# Patient Record
Sex: Male | Born: 1995
Health system: Southern US, Community
[De-identification: ages and names within clinical notes are randomized; demographics above are authoritative.]

## PROBLEM LIST (undated history)

## (undated) DIAGNOSIS — F32A Depression, unspecified: Secondary | ICD-10-CM

## (undated) DIAGNOSIS — I10 Essential (primary) hypertension: Secondary | ICD-10-CM

## (undated) DIAGNOSIS — IMO0002 Reserved for concepts with insufficient information to code with codable children: Secondary | ICD-10-CM

## (undated) DIAGNOSIS — F329 Major depressive disorder, single episode, unspecified: Secondary | ICD-10-CM

## (undated) DIAGNOSIS — M329 Systemic lupus erythematosus, unspecified: Secondary | ICD-10-CM

## (undated) DIAGNOSIS — M1990 Unspecified osteoarthritis, unspecified site: Secondary | ICD-10-CM

## (undated) DIAGNOSIS — T7840XA Allergy, unspecified, initial encounter: Secondary | ICD-10-CM

## (undated) DIAGNOSIS — D689 Coagulation defect, unspecified: Secondary | ICD-10-CM

## (undated) HISTORY — DX: Allergy, unspecified, initial encounter: T78.40XA

## (undated) HISTORY — PX: COSMETIC SURGERY: SHX468

## (undated) HISTORY — DX: Coagulation defect, unspecified: D68.9

## (undated) HISTORY — DX: Unspecified osteoarthritis, unspecified site: M19.90

## (undated) HISTORY — DX: Systemic lupus erythematosus, unspecified: M32.9

## (undated) HISTORY — DX: Reserved for concepts with insufficient information to code with codable children: IMO0002

## (undated) HISTORY — DX: Major depressive disorder, single episode, unspecified: F32.9

## (undated) HISTORY — PX: LYMPH NODE BIOPSY: SHX201

## (undated) HISTORY — PX: TONSILLECTOMY: SUR1361

## (undated) HISTORY — DX: Depression, unspecified: F32.A

---
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
## 2013-06-28 DIAGNOSIS — D693 Immune thrombocytopenic purpura: Secondary | ICD-10-CM | POA: Insufficient documentation

## 2014-12-27 ENCOUNTER — Ambulatory Visit (INDEPENDENT_AMBULATORY_CARE_PROVIDER_SITE_OTHER): Payer: BLUE CROSS/BLUE SHIELD | Admitting: Physician Assistant

## 2014-12-27 VITALS — BP 116/66 | HR 87 | Temp 98.7°F | Resp 18 | Ht 66.0 in | Wt 130.0 lb

## 2014-12-27 DIAGNOSIS — Z0000 Encounter for general adult medical examination without abnormal findings: Secondary | ICD-10-CM | POA: Diagnosis not present

## 2014-12-27 DIAGNOSIS — Z113 Encounter for screening for infections with a predominantly sexual mode of transmission: Secondary | ICD-10-CM | POA: Diagnosis not present

## 2014-12-27 DIAGNOSIS — M329 Systemic lupus erythematosus, unspecified: Secondary | ICD-10-CM | POA: Insufficient documentation

## 2014-12-27 DIAGNOSIS — D696 Thrombocytopenia, unspecified: Secondary | ICD-10-CM | POA: Diagnosis not present

## 2014-12-27 LAB — POCT CBC
Granulocyte percent: 75.7 %G (ref 37–80)
HCT, POC: 42.1 % — AB (ref 43.5–53.7)
HEMOGLOBIN: 13.7 g/dL — AB (ref 14.1–18.1)
Lymph, poc: 0.9 (ref 0.6–3.4)
MCH: 27.7 pg (ref 27–31.2)
MCHC: 32.5 g/dL (ref 31.8–35.4)
MCV: 85.1 fL (ref 80–97)
MID (CBC): 0.2 (ref 0–0.9)
MPV: 11.9 fL (ref 0–99.8)
PLATELET COUNT, POC: 28 10*3/uL — AB (ref 142–424)
POC Granulocyte: 3.5 (ref 2–6.9)
POC LYMPH PERCENT: 20.1 %L (ref 10–50)
POC MID %: 4.2 %M (ref 0–12)
RBC: 4.95 M/uL (ref 4.69–6.13)
RDW, POC: 12.8 %
WBC: 4.6 10*3/uL (ref 4.6–10.2)

## 2014-12-27 NOTE — Patient Instructions (Signed)
Your exam was normal today. Your platelet count remained low. Please follow up with your rheumatologist as scheduled.   Health Maintenance - 86-21 Years Old SCHOOL PERFORMANCE After high school, you may attend college or technical or vocational school, enroll in the [employer], or enter the workforce. PHYSICAL, SOCIAL, AND EMOTIONAL DEVELOPMENT  One hour of regular physical activity daily is recommended. Continue to participate in sports.  Develop your own interests and consider community service or volunteerism.  Make decisions about college and work plans.  Throughout these years, you should assume responsibility for your own health care. Increasing independence is important for you.  You may be exploring your sexual identity. Understand that you should never be in a situation that makes you feel uncomfortable, and tell your partner if you do not want to engage in sexual activity.  Body image may become important to you. Be mindful that eating disorders can develop at this time. Talk to your parents or other caregivers if you have concerns about body image, weight gain, or losing weight.  You may notice mood disturbances, depression, anxiety, attention problems, or trouble with alcohol. Talk to your health care provider if you have concerns about mental illness.  Set limits for yourself and talk with your parents or other caregivers about independent decision making.  Handle conflict without physical violence.  Avoid loud noises which may impair hearing.  Limit television and computer time to 2 hours each day. Individuals who engage in excessive inactivity are more likely to become overweight. RECOMMENDED IMMUNIZATIONS  Influenza vaccine.  All adults should be immunized every year.  All adults, including pregnant women and people with hives-only allergy to eggs, can receive the inactivated influenza (IIV) vaccine.  Adults aged 18-49 years can receive the recombinant influenza  (RIV) vaccine. The RIV vaccine does not contain any egg protein.  Tetanus, diphtheria, and acellular pertussis (Td, Tdap) vaccine.  Pregnant women should receive 1 dose of Tdap vaccine during each pregnancy. The dose should be obtained regardless of the length of time since the last dose. Immunization is preferred during the 27th to 36th week of gestation.  An adult who has not previously received Tdap or who does not know his or her vaccine status should receive 1 dose of Tdap. This initial dose should be followed by tetanus and diphtheria toxoids (Td) booster doses every 10 years.  Adults with an unknown or incomplete history of completing a 3-dose immunization series with Td-containing vaccines should begin or complete a primary immunization series including a Tdap dose.  Adults should receive a Td booster every 10 years.  Varicella vaccine.  An adult without evidence of immunity to varicella should receive 2 doses or a second dose if he or she has previously received 1 dose.  Pregnant females who do not have evidence of immunity should receive the first dose after pregnancy. This first dose should be obtained before leaving the health care facility. The second dose should be obtained 4-8 weeks after the first dose.  Human papillomavirus (HPV) vaccine.  Females aged 13-26 years who have not received the vaccine previously should obtain the 3-dose series.  The vaccine is not recommended for pregnant females. However, pregnancy testing is not needed before receiving a dose. If a male is found to be pregnant after receiving a dose, no treatment is needed. In that case, the remaining doses should be delayed until after the pregnancy.  Males aged 61-21 years who have not received the vaccine previously should receive the 3-dose  series. Males aged 22-26 years may be immunized.  Immunization is recommended through the age of 56 years for any male who has sex with males and did not get any or  all doses earlier.  Immunization is recommended for any person with an immunocompromised condition through the age of 60 years if he or she did not get any or all doses earlier.  During the 3-dose series, the second dose should be obtained 4-8 weeks after the first dose. The third dose should be obtained 24 weeks after the first dose and 16 weeks after the second dose.  Measles, mumps, and rubella (MMR) vaccine.  Adults born in [birth year] or later should have 1 or more doses of MMR vaccine unless there is a contraindication to the vaccine or there is laboratory evidence of immunity to each of the three diseases.  A routine second dose of MMR vaccine should be obtained at least 28 days after the first dose for students attending postsecondary schools, health care workers, and international travelers.  For females of childbearing age, rubella immunity should be determined. If there is no evidence of immunity, females who are not pregnant should be vaccinated. If there is no evidence of immunity, females who are pregnant should delay immunization until after pregnancy.  Pneumococcal 13-valent conjugate (PCV13) vaccine.  When indicated, a person who is uncertain of his or her immunization history and has no record of immunization should receive the PCV13 vaccine.  An adult aged 69 years or older who has certain medical conditions and has not been previously immunized should receive 1 dose of PCV13 vaccine. This PCV13 should be followed with a dose of pneumococcal polysaccharide (PPSV23) vaccine. The PPSV23 vaccine dose should be obtained at least 8 weeks after the dose of PCV13 vaccine.  An adult aged 46 years or older who has certain medical conditions and previously received 1 or more doses of PPSV23 vaccine should receive 1 dose of PCV13. The PCV13 vaccine dose should be obtained 1 or more years after the last PPSV23 vaccine dose.  Pneumococcal polysaccharide (PPSV23) vaccine.  When PCV13 is also  indicated, PCV13 should be obtained first.  An adult younger than age 44 years who has certain medical conditions should be immunized.  Any person who resides in a long-term care facility should be immunized.  An adult smoker should be immunized.  People with an immunocompromised condition and certain other conditions should receive both PCV13 and PPSV23 vaccines.  People with human immunodeficiency virus (HIV) infection should be immunized as soon as possible after diagnosis.  Immunization during chemotherapy or radiation therapy should be avoided.  Routine use of PPSV23 vaccine is not recommended for American Indians, [place] Natives, or people younger than 65 years unless there are medical conditions that require PPSV23 vaccine.  When indicated, people who have unknown immunization and have no record of immunization should receive PPSV23 vaccine.  One-time revaccination 5 years after the first dose of PPSV23 is recommended for people aged 19-64 years who have chronic kidney failure, nephrotic syndrome, asplenia, or immunocompromised conditions.  Meningococcal vaccine.  Adults with asplenia or persistent complement component deficiencies should receive 2 doses of quadrivalent meningococcal conjugate (MenACWY-D) vaccine. The doses should be obtained at least 2 months apart.  Microbiologists working with certain meningococcal bacteria, [employer], people at risk during an outbreak, and people who travel to or live in countries with a high rate of meningitis should be immunized.  A first-year college student up through age 9 years who  is living in a residence hall should receive a dose if he or she did not receive a dose on or after his or her 16th birthday.  Adults who have certain high-risk conditions should receive one or more doses of vaccine.  Hepatitis A vaccine.  Adults who wish to be protected from this disease, have certain high-risk conditions, work with hepatitis  A-infected animals, work in hepatitis A research labs, or travel to or work in countries with a high rate of hepatitis A should be immunized.  Adults who were previously unvaccinated and who anticipate close contact with an international adoptee during the first 60 days after arrival in the [place] from a country with a high rate of hepatitis A should be immunized.  Hepatitis B vaccine.  Adults who wish to be protected from this disease, have certain high-risk conditions, may be exposed to blood or other infectious body fluids, are household contacts or sex partners of hepatitis B positive people, are clients or workers in certain care facilities, or travel to or work in countries with a high rate of hepatitis B should be immunized.  Haemophilus influenzae type b (Hib) vaccine.  A previously unvaccinated person with asplenia or sickle cell disease or having a scheduled splenectomy should receive 1 dose of Hib vaccine.  Regardless of previous immunization, a recipient of a hematopoietic stem cell transplant should receive a 3-dose series 6-12 months after his or her successful transplant.  Hib vaccine is not recommended for adults with HIV infection. TESTING  Annual screening for vision and hearing problems is recommended. Vision should be screened at least once between 84-63 years of age.  You may be screened for anemia or tuberculosis.  You should have a blood test to check for high cholesterol.  You should be screened for alcohol and drug use.  If you are sexually active, you may be screened for sexually transmitted infections (STIs), pregnancy, or HIV. You should be screened for STIs if:  Your sexual activity has changed since the last screening test, and you are at an increased risk for chlamydia or gonorrhea. Ask your health care provider if you are at risk.  If you are at an increased risk for hepatitis B, you should be screened for this virus. You are considered at high risk  for hepatitis B if you:  Were born in a country where hepatitis B occurs often. Talk with your health care provider about which countries are considered high risk.  Have parents who were born in a high-risk country and have not received a shot to protect against hepatitis B (hepatitis B vaccine).  Have HIV or AIDS.  Use needles to inject street drugs.  Live with or have sex with someone who has hepatitis B.  Are a man who has sex with other men (MSM).  Get hemodialysis treatment.  Take certain medicines for conditions like cancer, organ transplantation, or autoimmune conditions. NUTRITION   You should:  Have three servings of low-fat milk and dairy products daily. If you do not drink milk or consume dairy products, you should eat calcium-enriched foods, such as juice, bread, or cereal. Dark, leafy greens or canned fish are alternate sources of calcium.  Drink plenty of water. Fruit juice should be limited to 8-12 oz (240-360 mL) each day. Sugary beverages and sodas should be avoided.  Avoid eating foods high in fat, salt, or sugar, such as chips, candy, and cookies.  Avoid fast foods and limit eating out at restaurants.  Try  not to skip meals, especially breakfast. You should eat a variety of vegetables, fruits, and lean meats.  Eat meals together as a family whenever possible. ORAL HEALTH Brush your teeth twice a day and floss at least once a day. You should have two dental exams a year.  SKIN CARE You should wear sunscreen when out in the sun. TALK TO SOMEONE ABOUT:  Precautions against pregnancy, contraception, and sexually transmitted infections.  Taking a prescription medicine daily to prevent HIV infection if you are at risk of being infected with HIV. This is called preexposure prophylaxis (PrEP). You are at risk if you:  Are a male who has sex with other males (MSM).  Are heterosexual and sexually active with more than one partner.  Take drugs by  injection.  Are sexually active with a partner who has HIV.  Whether you are at high risk of being infected with HIV. If you choose to begin PrEP, you should first be tested for HIV. You should then be tested every 3 months for as long as you are taking PrEP.  Drug, tobacco, and alcohol use among your friends or at friends' homes. Smoking tobacco or marijuana and taking drugs have health consequences and may impact your brain development.  Appropriate use of over-the-counter or prescription medicines.  Driving guidelines and riding with friends.  The risks of drinking and driving or boating. Call someone if you have been drinking or using drugs and need a ride. WHAT'S NEXT? Visit your pediatrician or family physician once a year. By young adulthood, you should transition from your pediatrician to a family physician or internal medicine specialist. If you are a male and are sexually active, you may want to begin annual physical exams with a gynecologist. Document Released: 12/18/2006 Document Revised: 09/27/2013 Document Reviewed: 01/07/2007 [facility] Patient Information 2015 [place], [place]. This information is not intended to replace advice given to you by your health care provider. Make sure you discuss any questions you have with your health care provider.

## 2014-12-27 NOTE — Progress Notes (Signed)
Subjective:    Patient ID: Chase Townsend, male    DOB: November 10, 1995, 19 y.o.   MRN: [id number]  Chief Complaint  Patient presents with  . Annual Exam   Patient Active Problem List   Diagnosis Date Noted  . Lupus 12/27/2014   Prior to Admission medications   Medication Sig Start Date End Date Taking? Authorizing Provider  hydroxychloroquine (PLAQUENIL) 200 MG tablet Take by mouth daily.   Yes Historical Provider, MD  loratadine (CLARITIN) 10 MG tablet Take 10 mg by mouth daily.   Yes Historical Provider, MD  predniSONE (DELTASONE) 10 MG tablet Take 10 mg by mouth daily with breakfast.   Yes Historical Provider, MD   Medications, allergies, past medical history, surgical history, family history, social history and problem list reviewed and updated.  HPI  [age] yom with pmh lupus with resulting low plt ct presents for cpe.   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 They require physical exam prior to starting. He starts next week. 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   Lupus - diag at age 89 yo. On hydroxychloroquine and prednisone qd. Sees [facility] rheumatology q 3 months. Next appt in 2 wks. Has hx low plts per pt due to lupus. Has occasional joint aches hips due to lupus. Pt states rheum follows all these conditions. Takes tylenol prn for joint pain. States he felt he was depressed for awhile after gma passed 2 yrs ago but was never diagnosed and never started any meds. Feeling much better now.   Up to date on all immunizations through his rheumatologist as he is on daily pred.  He does not exercise. Does not follow specific diet. Denies current issues, aches, pains, complaints.   Would like to get cbc checked today to monitor his plt ct. Agrees to HIV testing but declines any further HIV testing. Sexually active with one male partner. Condoms sometimes.    Positive ROS on sheet: HA: Assoc with allergies. Seasonal. Since childhood. No recent worsening.  Allergies:   Takes Claritin every day.  Bleds Easily: States has low plts due to lupus. Rheum follows.  Cough: Post nasal drip. Stable during allergy season. Not prod. No fever, chills, night sweats, unintentional wt loss. Dental probs - Needs work on lower tooth but cant get worked on till plt ct 50k,last checked was [employer]30k.   Review of Systems See HPI. ROS negative unless mentioned above.     Objective:   Physical Exam  Constitutional: He is oriented to person, place, and time. He appears well-developed and well-nourished.  Non-toxic appearance. He does not have a sickly appearance. He does not appear ill. No distress.  BP 116/66 mmHg  Pulse 87  Temp(Src) 98.7 F (37.1 C) (Oral)  Resp 18  Ht 5\' 6"  (1.676 m)  Wt 130 lb (58.968 kg)  BMI 20.99 kg/m2  SpO2 99%   HENT:  Right Ear: Tympanic membrane normal.  Left Ear: Tympanic membrane normal.  Mouth/Throat: Uvula is midline and oropharynx is clear and moist.  Eyes: Conjunctivae and EOM are normal. Pupils are equal, round, and reactive to light.  Neck: Trachea normal and normal range of motion. No spinous process tenderness and no muscular tenderness present. Carotid bruit is not present. No thyroid mass and no thyromegaly present.  Cardiovascular: Normal rate, regular rhythm and normal heart sounds.  Exam reveals no gallop.   No murmur heard. Pulses:      Dorsalis pedis pulses are 2+ on the right side,  and 2+ on the left side.  Pulmonary/Chest: Effort normal and breath sounds normal. He has no decreased breath sounds. He has no wheezes. He has no rhonchi. He has no rales.  Abdominal: Soft. Normal appearance and bowel sounds are normal. There is no hepatosplenomegaly. There is no tenderness. No hernia. Hernia confirmed negative in the right inguinal area and confirmed negative in the left inguinal area.  Genitourinary: Testes normal and penis normal. Right testis shows no mass. Left testis shows no mass.  Musculoskeletal: Normal range of motion.    Neurological: He is alert and oriented to person, place, and time. He has normal strength. No cranial nerve deficit or sensory deficit.  Psychiatric: He has a normal mood and affect. His speech is normal and behavior is normal.   Results for orders placed or performed in visit on 12/27/14  POCT CBC  Result Value Ref Range   WBC 4.6 4.6 - 10.2 K/uL   Lymph, poc 0.9 0.6 - 3.4   POC LYMPH PERCENT 20.1 10 - 50 %L   MID (cbc) 0.2 0 - 0.9   POC MID % 4.2 0 - 12 %M   POC Granulocyte 3.5 2 - 6.9   Granulocyte percent 75.7 37 - 80 %G   RBC 4.95 4.69 - 6.13 M/uL   Hemoglobin 13.7 (A) 14.1 - 18.1 g/dL   HCT, POC 16.1 (A) 09.6 - 53.7 %   MCV 85.1 80 - 97 fL   MCH, POC 27.7 27 - 31.2 pg   MCHC 32.5 31.8 - 35.4 g/dL   RDW, POC 04.5 %   Platelet Count, POC 28 (A) 142 - 424 K/uL   MPV 11.9 0 - 99.8 fL      Assessment & Plan:   19 yom with pmh lupus with resulting low plt ct presents for cpe.   Annual physical exam --normal vitals, normal exam --states he needed this through work --rtc as needed  Thrombocytopenia - Plan: POCT CBC Lupus - Plan: POCT CBC --followed by [facility] rhe[facility], low plts today as normal per pt, pt wanted to check his level, planning to follow with rheum in 2 wks  Screen for STD (sexually transmitted disease) - Plan: HIV antibody --HIV screen today, pt declines any other std testing  Donnajean Lopes, PA-C Physician Assistant-Certified [facility]  12/27/2014 5:36 PM

## 2014-12-28 LAB — HIV ANTIBODY (ROUTINE TESTING W REFLEX): HIV 1&2 Ab, 4th Generation: NONREACTIVE

## 2016-02-08 ENCOUNTER — Emergency Department (HOSPITAL_COMMUNITY)
Admission: EM | Admit: 2016-02-08 | Discharge: 2016-02-08 | Disposition: A | Discharge status: Home / Self Care | Payer: BLUE CROSS/BLUE SHIELD | Attending: Emergency Medicine | Admitting: Emergency Medicine

## 2016-02-08 ENCOUNTER — Encounter (HOSPITAL_COMMUNITY): Payer: Self-pay

## 2016-02-08 DIAGNOSIS — F321 Major depressive disorder, single episode, moderate: Secondary | ICD-10-CM | POA: Diagnosis present

## 2016-02-08 DIAGNOSIS — F1099 Alcohol use, unspecified with unspecified alcohol-induced disorder: Secondary | ICD-10-CM | POA: Insufficient documentation

## 2016-02-08 DIAGNOSIS — F329 Major depressive disorder, single episode, unspecified: Secondary | ICD-10-CM | POA: Insufficient documentation

## 2016-02-08 DIAGNOSIS — R45851 Suicidal ideations: Secondary | ICD-10-CM | POA: Insufficient documentation

## 2016-02-08 DIAGNOSIS — Z7289 Other problems related to lifestyle: Secondary | ICD-10-CM | POA: Diagnosis not present

## 2016-02-08 DIAGNOSIS — M1990 Unspecified osteoarthritis, unspecified site: Secondary | ICD-10-CM | POA: Insufficient documentation

## 2016-02-08 DIAGNOSIS — Z789 Other specified health status: Secondary | ICD-10-CM | POA: Insufficient documentation

## 2016-02-08 DIAGNOSIS — Z79899 Other long term (current) drug therapy: Secondary | ICD-10-CM | POA: Diagnosis not present

## 2016-02-08 DIAGNOSIS — Z7952 Long term (current) use of systemic steroids: Secondary | ICD-10-CM | POA: Diagnosis not present

## 2016-02-08 DIAGNOSIS — D696 Thrombocytopenia, unspecified: Secondary | ICD-10-CM | POA: Diagnosis not present

## 2016-02-08 DIAGNOSIS — F32A Depression, unspecified: Secondary | ICD-10-CM | POA: Diagnosis present

## 2016-02-08 LAB — COMPREHENSIVE METABOLIC PANEL
ALT: 25 U/L (ref 17–63)
ANION GAP: 8 (ref 5–15)
AST: 32 U/L (ref 15–41)
Albumin: 4.6 g/dL (ref 3.5–5.0)
Alkaline Phosphatase: 67 U/L (ref 38–126)
BILIRUBIN TOTAL: 1 mg/dL (ref 0.3–1.2)
BUN: 12 mg/dL (ref 6–20)
CHLORIDE: 109 mmol/L (ref 101–111)
CO2: 27 mmol/L (ref 22–32)
Calcium: 9.1 mg/dL (ref 8.9–10.3)
Creatinine, Ser: 0.59 mg/dL — ABNORMAL LOW (ref 0.61–1.24)
Glucose, Bld: 91 mg/dL (ref 65–99)
POTASSIUM: 4 mmol/L (ref 3.5–5.1)
Sodium: 144 mmol/L (ref 135–145)
TOTAL PROTEIN: 8.5 g/dL — AB (ref 6.5–8.1)

## 2016-02-08 LAB — RAPID URINE DRUG SCREEN, HOSP PERFORMED
AMPHETAMINES: NOT DETECTED
BENZODIAZEPINES: NOT DETECTED
Barbiturates: NOT DETECTED
Cocaine: NOT DETECTED
OPIATES: NOT DETECTED
Tetrahydrocannabinol: NOT DETECTED

## 2016-02-08 LAB — CBC
HCT: 41.2 % (ref 39.0–52.0)
Hemoglobin: 13.9 g/dL (ref 13.0–17.0)
MCH: 28.8 pg (ref 26.0–34.0)
MCHC: 33.7 g/dL (ref 30.0–36.0)
MCV: 85.5 fL (ref 78.0–100.0)
PLATELETS: 64 10*3/uL — AB (ref 150–400)
RBC: 4.82 MIL/uL (ref 4.22–5.81)
RDW: 13.1 % (ref 11.5–15.5)
WBC: 2.5 10*3/uL — AB (ref 4.0–10.5)

## 2016-02-08 LAB — SALICYLATE LEVEL

## 2016-02-08 LAB — ACETAMINOPHEN LEVEL

## 2016-02-08 LAB — ETHANOL: ALCOHOL ETHYL (B): 115 mg/dL — AB (ref <5)

## 2016-02-08 MED ORDER — HYDROXYCHLOROQUINE SULFATE 200 MG PO TABS
200.0000 mg | ORAL_TABLET | Freq: Every day | ORAL | Status: DC
  Administered 2016-02-08: 200 mg via ORAL
  Filled 2016-02-08 (×2): qty 1

## 2016-02-08 MED ORDER — LORAZEPAM 1 MG PO TABS: 1.0000 mg | ORAL_TABLET | Freq: Three times a day (TID) | ORAL | Status: DC | PRN

## 2016-02-08 MED ORDER — PREDNISONE 20 MG PO TABS
10.0000 mg | ORAL_TABLET | Freq: Every day | ORAL | Status: DC
  Administered 2016-02-08: 10 mg via ORAL
  Filled 2016-02-08: qty 1

## 2016-02-08 MED ORDER — ZOLPIDEM TARTRATE 5 MG PO TABS: 5.0000 mg | ORAL_TABLET | Freq: Every evening | ORAL | Status: DC | PRN

## 2016-02-08 MED ORDER — MYCOPHENOLATE SODIUM 180 MG PO TBEC
360.0000 mg | DELAYED_RELEASE_TABLET | Freq: Two times a day (BID) | ORAL | Status: DC
  Administered 2016-02-08: 360 mg via ORAL
  Filled 2016-02-08 (×3): qty 2

## 2016-02-08 MED ORDER — ACETAMINOPHEN 325 MG PO TABS: 650.0000 mg | ORAL_TABLET | ORAL | Status: DC | PRN

## 2016-02-08 MED ORDER — ALUM & MAG HYDROXIDE-SIMETH 200-200-20 MG/5ML PO SUSP: 30.0000 mL | ORAL | Status: DC | PRN

## 2016-02-08 MED ORDER — ONDANSETRON HCL 4 MG PO TABS: 4.0000 mg | ORAL_TABLET | Freq: Three times a day (TID) | ORAL | Status: DC | PRN

## 2016-02-08 MED ORDER — LORATADINE 10 MG PO TABS
10.0000 mg | ORAL_TABLET | Freq: Every day | ORAL | Status: DC
  Administered 2016-02-08: 10 mg via ORAL
  Filled 2016-02-08 (×2): qty 1

## 2016-02-08 NOTE — ED Notes (Signed)
Pt admitted to room #41. Pt behavior calm and cooperative, pleasant on approach. Pt reports "I have had suicidal thoughts since 14, I manage them well." Endorsing SI. Denies HI. Denies AVH. Denies previous suicide attempt. Pt reports this is the first time his family has been aware of these thoughts, resulting in his mother calling [employer] and pt coming to the ED. Pt reports his sister attempted suicide 5 years ago. Pt reports he is currently not on any medication for mental health. 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 Pt verbalizes exams are coming up. Reports he went to counseling at school last year (2016) d/t his grandmothers passing a few years ago. Pt denies hx of aggression. Denies use of drugs. Pt reports weekly use of Alcohol. Reports he was at a party last night and had his last drink at 0400. Pt denies any withdrawal symptoms at this time. Special checks q 15 mins in place for safety. Video monitoring in place.

## 2016-02-08 NOTE — Discharge Instructions (Addendum)
For your ongoing behavioral health needs, you are advised to follow up with the Partial Hospitalization Program at the [facility] [place].  The contact person for this program is [facility] Bowman.  Call her at your earliest opportunity to ask about enrolling in the program:       [facility]      [address]      [place], [place] [address]      Contact person: Claudius SisMary Alice Bowman      [phone]   For academic support for your behavioral health needs you are advised to follow up at your earliest opportunity with the Counseling Center at [facility] [employer] [facility].  New patients are seen during walking hours, Monday - Friday from 8:00 am - 5:00 pm.  Emergency Department staff has notified them by telephone of your visit to the ED and your recommended follow up:       [facility] [employer]      Counseling Services      [address]      [phone]   If you have a behavioral health crisis, call the [facility] Mobile Crisis Team.  They are available 24 hours a day, 7 days a week.  Call them and they will have a clinician come to you:       [facility] Crisis Team      [phone]

## 2016-02-08 NOTE — ED Notes (Signed)
Pt presents w/ [facility] c/o suicidal thoughts x "6 years" increasing today.  Pt reports plan to "drive over an overpass."  Pt reports that he was bullied as a child which prompted the SI and sts he broke up with his girlfriend this morning.  Pt has never been evaluated by a counselor or psychiatrist.  Denies HI/AV.

## 2016-02-08 NOTE — Consult Note (Signed)
[facility] Face-to-Face Psychiatry Consult   Reason for Consult:  Depression and suicidal ideations Referring Physician:  EDP Patient Identification: Chase Townsend MRN:  [id number] Principal Diagnosis: Major depressive disorder, single episode, moderate ([place]) Diagnosis:   Patient Active Problem List   Diagnosis Date Noted  . Major depressive disorder, single episode, moderate ([place]) [F32.1] 02/08/2016    Priority: High  . Lupus ([place]) [M32.9] 12/27/2014    Total Time spent with patient: 45 minutes  Subjective:   Chase Townsend is a 20 y.o. male patient does not want admission.  HPI:  20 yo male who presented to the ED with depression and suicidal ideations.  He reports having dealt with his stressors himself until now.  Chase Townsend does not have suicidal ideations today and feels much better after his mother visited.  He reports she is his major support system along with an extensive supportive family.  His mother and he would like outpatient or intensive outpatient.  The plan is for him to go home this weekend with his mother and family.  He will then return to school next week for his exams then go to outpatient and home for the summer.  No homicidal ideations, hallucinations, and substance abuse.  The psychiatrist, Dr. Darleene Cleaver, recommended inpatient hospitalization but family and patient did not want to go forward with the admission.  No past suicide attempts and no current suicidal ideations, strong support system who is willing to take responsibility.  Resources for intensive outpatient therapy and crisis numbers provided.  Past Psychiatric History: none  Risk to Self: Suicidal Ideation: Yes-Currently Present Suicidal Intent: No Is patient at risk for suicide?: No Suicidal Plan?: No Access to Means: No What has been your use of drugs/alcohol within the last 12 months?: Current alcohol use How many times?: 0 Other Self Harm Risks: none Triggers for Past Attempts: Unpredictable (school, family,  relationships) Intentional Self Injurious Behavior: None Risk to Others: Homicidal Ideation: No Thoughts of Harm to Others: No Current Homicidal Intent: No Current Homicidal Plan: No Access to Homicidal Means: No Identified Victim: na History of harm to others?: No Assessment of Violence: None Noted Violent Behavior Description: na Does patient have access to weapons?: No Criminal Charges Pending?: No Does patient have a court date: No Prior Inpatient Therapy: Prior Inpatient Therapy: No Prior Therapy Dates: na Prior Therapy Facilty/Provider(s): na Reason for Treatment: na Prior Outpatient Therapy: Prior Outpatient Therapy: Yes Prior Therapy Dates: 2016 Prior Therapy Facilty/Provider(s): [facility] Reason for Treatment: Depression Does patient have an ACCT team?: No Does patient have Intensive In-House Services?  : No Does patient have [facility] services? : No Does patient have P4[facility] services?: No  Past Medical History:  Past Medical History  Diagnosis Date  . Allergy   . Arthritis   . Clotting disorder ([place])   . Depression   . Lupus [facility])     Past Surgical History  Procedure Laterality Date  . Tonsillectomy    . Cosmetic surgery    . Lymph node biopsy     Family History:  Family History  Problem Relation Age of Onset  . Stroke Maternal Grandmother   . Cancer Maternal Grandmother   . Diabetes Maternal Grandmother   . Hyperlipidemia Maternal Grandfather    Family Psychiatric  History: none Social History:  History  Alcohol Use  . 2.4 oz/week  . 4 Standard drinks or equivalent per week     History  Drug Use No    Social History   Social History  .  Marital Status: Single    Spouse Name: N/A  . Number of Children: N/A  . Years of Education: N/A   Social History Main Topics  . Smoking status: Never Smoker   . Smokeless tobacco: None  . Alcohol Use: 2.4 oz/week    4 Standard drinks or equivalent per week  . Drug Use: No  . Sexual Activity: Yes   Other  Topics Concern  . None   Social History Narrative   Additional Social History:    Allergies:   Allergies  Allergen Reactions  . Aspirin   . Singulair [Montelukast Sodium]     Labs:  Results for orders placed or performed during the hospital encounter of 02/08/16 (from the past 48 hour(s))  Comprehensive metabolic panel     Status: Abnormal   Collection Time: 02/08/16  8:20 AM  Result Value Ref Range   Sodium 144 135 - 145 mmol/L   Potassium 4.0 3.5 - 5.1 mmol/L   Chloride 109 101 - 111 mmol/L   CO2 27 22 - 32 mmol/L   Glucose, Bld 91 65 - 99 mg/dL   BUN 12 6 - 20 mg/dL   Creatinine, Ser 0.59 (L) 0.61 - 1.24 mg/dL   Calcium 9.1 8.9 - 10.3 mg/dL   Total Protein 8.5 (H) 6.5 - 8.1 g/dL   Albumin 4.6 3.5 - 5.0 g/dL   AST 32 15 - 41 U/L   ALT 25 17 - 63 U/L   Alkaline Phosphatase 67 38 - 126 U/L   Total Bilirubin 1.0 0.3 - 1.2 mg/dL   GFR calc non Af Amer >60 >60 mL/min   GFR calc Af Amer >60 >60 mL/min    Comment: (NOTE) The eGFR has been calculated using the CKD EPI equation. This calculation has not been validated in all clinical situations. eGFR's persistently <60 mL/min signify possible Chronic Kidney Disease.    Anion gap 8 5 - 15  Ethanol     Status: Abnormal   Collection Time: 02/08/16  8:20 AM  Result Value Ref Range   Alcohol, Ethyl (B) 115 (H) <5 mg/dL    Comment:        LOWEST DETECTABLE LIMIT FOR SERUM ALCOHOL IS 5 mg/dL FOR MEDICAL PURPOSES ONLY   Salicylate level     Status: None   Collection Time: 02/08/16  8:20 AM  Result Value Ref Range   Salicylate Lvl <7.9 2.8 - 30.0 mg/dL  Acetaminophen level     Status: Abnormal   Collection Time: 02/08/16  8:20 AM  Result Value Ref Range   Acetaminophen (Tylenol), Serum <10 (L) 10 - 30 ug/mL    Comment:        THERAPEUTIC CONCENTRATIONS VARY SIGNIFICANTLY. A RANGE OF 10-30 ug/mL MAY BE AN EFFECTIVE CONCENTRATION FOR MANY PATIENTS. HOWEVER, SOME ARE BEST TREATED AT CONCENTRATIONS OUTSIDE  THIS RANGE. ACETAMINOPHEN CONCENTRATIONS >150 ug/mL AT 4 HOURS AFTER INGESTION AND >50 ug/mL AT 12 HOURS AFTER INGESTION ARE OFTEN ASSOCIATED WITH TOXIC REACTIONS.   cbc     Status: Abnormal   Collection Time: 02/08/16  8:20 AM  Result Value Ref Range   WBC 2.5 (L) 4.0 - 10.5 K/uL   RBC 4.82 4.22 - 5.81 MIL/uL   Hemoglobin 13.9 13.0 - 17.0 g/dL   HCT 41.2 39.0 - 52.0 %   MCV 85.5 78.0 - 100.0 fL   MCH 28.8 26.0 - 34.0 pg   MCHC 33.7 30.0 - 36.0 g/dL   RDW 13.1 11.5 - 15.5 %  Platelets 64 (L) 150 - 400 K/uL    Comment: SPECIMEN CHECKED FOR CLOTS PLATELET COUNT CONFIRMED BY SMEAR   Rapid urine drug screen (hospital performed)     Status: None   Collection Time: 02/08/16  8:31 AM  Result Value Ref Range   Opiates NONE DETECTED NONE DETECTED   Cocaine NONE DETECTED NONE DETECTED   Benzodiazepines NONE DETECTED NONE DETECTED   Amphetamines NONE DETECTED NONE DETECTED   Tetrahydrocannabinol NONE DETECTED NONE DETECTED   Barbiturates NONE DETECTED NONE DETECTED    Comment:        DRUG SCREEN FOR MEDICAL PURPOSES ONLY.  IF CONFIRMATION IS NEEDED FOR ANY PURPOSE, NOTIFY LAB WITHIN 5 DAYS.        LOWEST DETECTABLE LIMITS FOR URINE DRUG SCREEN Drug Class       Cutoff (ng/mL) Amphetamine      1000 Barbiturate      200 Benzodiazepine   960 Tricyclics       454 Opiates          300 Cocaine          300 THC              50     Current Facility-Administered Medications  Medication Dose Route Frequency Provider Last Rate Last Dose  . acetaminophen (TYLENOL) tablet 650 mg  650 mg Oral Q4H PRN Mercedes Camprubi-Soms, PA-C      . alum & mag hydroxide-simeth (MAALOX/MYLANTA) 200-200-20 MG/5ML suspension 30 mL  30 mL Oral PRN Mercedes Camprubi-Soms, PA-C      . hydroxychloroquine (PLAQUENIL) tablet 200 mg  200 mg Oral Daily Mercedes Camprubi-Soms, PA-C   200 mg at 02/08/16 1049  . loratadine (CLARITIN) tablet 10 mg  10 mg Oral Daily Mercedes Camprubi-Soms, PA-C   10 mg at 02/08/16  1047  . LORazepam (ATIVAN) tablet 1 mg  1 mg Oral Q8H PRN Mercedes Camprubi-Soms, PA-C      . mycophenolate (MYFORTIC) EC tablet 360 mg  360 mg Oral BID Mercedes Camprubi-Soms, PA-C   360 mg at 02/08/16 1047  . ondansetron (ZOFRAN) tablet 4 mg  4 mg Oral Q8H PRN Mercedes Camprubi-Soms, PA-C      . predniSONE (DELTASONE) tablet 10 mg  10 mg Oral Q breakfast Mercedes Camprubi-Soms, PA-C   10 mg at 02/08/16 0945  . zolpidem (AMBIEN) tablet 5 mg  5 mg Oral QHS PRN Mercedes Camprubi-Soms, PA-C       Current Outpatient Prescriptions  Medication Sig Dispense Refill  . hydroxychloroquine (PLAQUENIL) 200 MG tablet Take by mouth daily.    Marland Kitchen loratadine (CLARITIN) 10 MG tablet Take 10 mg by mouth daily.    . predniSONE (DELTASONE) 10 MG tablet Take 10 mg by mouth daily with breakfast.      Musculoskeletal: Strength & Muscle Tone: within normal limits Gait & Station: normal Patient leans: N/A  Psychiatric Specialty Exam: Review of Systems  Constitutional: Negative.   HENT: Negative.   Eyes: Negative.   Respiratory: Negative.   Cardiovascular: Negative.   Gastrointestinal: Negative.   Genitourinary: Negative.   Musculoskeletal: Negative.   Skin: Negative.   Neurological: Negative.   Endo/Heme/Allergies: Negative.   Psychiatric/Behavioral: Positive for depression.    Blood pressure 140/108, pulse 81, temperature 98.2 F (36.8 C), temperature source Oral, resp. rate 14, SpO2 100 %.There is no weight on file to calculate BMI.  General Appearance: Casual  Eye Contact::  Fair  Speech:  Normal Rate  Volume:  Normal  Mood:  Depressed  Affect:  Congruent  Thought Process:  Coherent  Orientation:  Full (Time, Place, and Person)  Thought Content:  Rumination  Suicidal Thoughts:  No  Homicidal Thoughts:  No  Memory:  Immediate;   Good Recent;   Good Remote;   Good  Judgement:  Fair  Insight:  Good  Psychomotor Activity:  Normal  Concentration:  Good  Recall:  Good  Fund of  Knowledge:Good  Language: Good  Akathisia:  No  Handed:  Right  AIMS (if indicated):     Assets:  Communication Skills Desire for Improvement Financial Resources/Insurance Housing Intimacy Leisure Time Physical Health Resilience Social Support Talents/Skills Transportation Vocational/Educational  ADL's:  Intact  Cognition: WNL  Sleep:      Treatment Plan Summary: Daily contact with patient to assess and evaluate symptoms and progress in treatment, Medication management and Plan major depressive disorder, single episode moderater without psychotic features:  -Crisis stabilization -Medication management:  None at this time -Individual counseling  -Referral to [facility] intensive outpatient -Crisis numbers provided  Disposition: Recommended inpatient but family and patient declined  Waylan Boga, NP 02/08/2016 11:36 AM Patient seen face-to-face for psychiatric evaluation, chart reviewed and case discussed with the physician extender and developed treatment plan. Reviewed the information documented and agree with the treatment plan. Corena Pilgrim, MD

## 2016-02-08 NOTE — ED Notes (Signed)
TTS at bedside. 

## 2016-02-08 NOTE — ED Notes (Signed)
Bed: [facility]4 Expected date:  Expected time:  Means of arrival:  Comments: 

## 2016-02-08 NOTE — ED Notes (Addendum)
Pt changed into scrubs.  Pt and belongings wanded by Security.  

## 2016-02-08 NOTE — ED Provider Notes (Signed)
CSN: [id number]     Arrival date & time 02/08/16  0744 History   First MD Initiated Contact with Patient 02/08/16 [phone]     Chief Complaint  Patient presents with  . Suicidal     (Consider location/radiation/quality/duration/timing/severity/associated sxs/prior Treatment) HPI Comments: Chase Townsend is a 20 y.o. male with a PMHx of arthritis, depression, thrombocytopenia, and lupus, who presents to the ED via [facility] with complaints of suicidal ideations with a plan to drive off of an overpass. Patient states he has had 6 years of suicidal thoughts, but they increased today because he broke up with his girlfriend this morning. He states that he became very emotional and told his family that he was thinking about suicide, they called [employer] to bring him here for help. He is here voluntarily. He has never been evaluated by a psychiatrist or counselor, and he takes no psychiatric medications. He denies any HI/AVH, drug use, smoking, or any other medical complaints at this time. He does admit to drinking socially, drink last night approximately 4-5 shots of tequila between 10 PM and 2 AM, but states that this is a very infrequent occurrence. He is in college. Takes his prednisone, plaquenil, and mycophenolate regularly (followed by [facility] for lupus).   Patient is a 20 y.o. male presenting with mental health disorder. The history is provided by the patient and the police. No language interpreter was used.  Mental Health Problem Presenting symptoms: depression and suicidal thoughts   Presenting symptoms: no hallucinations and no homicidal ideas   Patient accompanied by:  Law enforcement Onset quality:  Gradual Duration: 6 years. Timing:  Constant Progression:  Waxing and waning Chronicity:  Chronic Context: stressful life event   Treatment compliance:  Untreated Relieved by:  None tried Worsened by:  Nothing tried Ineffective treatments:  None tried Associated symptoms: no abdominal pain and no chest  pain   Risk factors: hx of mental illness     Past Medical History  Diagnosis Date  . Allergy   . Arthritis   . Clotting disorder   . Depression   . Lupus    Past Surgical History  Procedure Laterality Date  . Tonsillectomy    . Cosmetic surgery    . Lymph node biopsy     Family History  Problem Relation Age of Onset  . Stroke Maternal Grandmother   . Cancer Maternal Grandmother   . Diabetes Maternal Grandmother   . Hyperlipidemia Maternal Grandfather    Social History  Substance Use Topics  . Smoking status: Never Smoker   . Smokeless tobacco: Not on file  . Alcohol Use: 2.4 oz/week    4 Standard drinks or equivalent per week    Review of Systems  Constitutional: Negative for fever and chills.  Respiratory: Negative for shortness of breath.   Cardiovascular: Negative for chest pain.  Gastrointestinal: Negative for nausea, vomiting, abdominal pain, diarrhea and constipation.  Genitourinary: Negative for dysuria and hematuria.  Musculoskeletal: Negative for myalgias and arthralgias.  Skin: Negative for color change.  Allergic/Immunologic: Positive for immunocompromised state (on plaquenil and mycophenolate).  Neurological: Negative for weakness and numbness.  Psychiatric/Behavioral: Positive for suicidal ideas. Negative for homicidal ideas, hallucinations and confusion.   10 Systems reviewed and are negative for acute change except as noted in the HPI.    Allergies  Aspirin and Singulair  Home Medications   Prior to Admission medications   Medication Sig Start Date End Date Taking? Authorizing Provider  hydroxychloroquine (PLAQUENIL) 200 MG tablet  Take by mouth daily.    Historical Provider, MD  loratadine (CLARITIN) 10 MG tablet Take 10 mg by mouth daily.    Historical Provider, MD  predniSONE (DELTASONE) 10 MG tablet Take 10 mg by mouth daily with breakfast.    Historical Provider, MD   BP 140/108 mmHg  Pulse 81  Temp(Src) 98.2 F (36.8 C) (Oral)  Resp  14  SpO2 100% Physical Exam  Constitutional: He is oriented to person, place, and time. Vital signs are normal. He appears well-developed and well-nourished.  Non-toxic appearance. No distress.  Afebrile, nontoxic, NAD  HENT:  Head: Normocephalic and atraumatic.  Mouth/Throat: Oropharynx is clear and moist and mucous membranes are normal.  Eyes: Conjunctivae and EOM are normal. Right eye exhibits no discharge. Left eye exhibits no discharge.  Neck: Normal range of motion. Neck supple.  Cardiovascular: Normal rate, regular rhythm, normal heart sounds and intact distal pulses.  Exam reveals no gallop and no friction rub.   No murmur heard. Pulmonary/Chest: Effort normal and breath sounds normal. No respiratory distress. He has no decreased breath sounds. He has no wheezes. He has no rhonchi. He has no rales.  Abdominal: Soft. Normal appearance and bowel sounds are normal. He exhibits no distension. There is no tenderness. There is no rigidity, no rebound, no guarding, no CVA tenderness, no tenderness at McBurney's point and negative Murphy's sign.  Musculoskeletal: Normal range of motion.  Neurological: He is alert and oriented to person, place, and time. He has normal strength. No sensory deficit.  Skin: Skin is warm, dry and intact. No rash noted.  Psychiatric: He is not actively hallucinating. He exhibits a depressed mood. He expresses suicidal ideation. He expresses no homicidal ideation. He expresses suicidal plans. He expresses no homicidal plans.  Pleasant and cooperative, slightly depressed affect, endorsing SI with plan to drive off overpass, but denies HI/AVH  Nursing note and vitals reviewed.   ED Course  Procedures (including critical care time) Labs Review Labs Reviewed  COMPREHENSIVE METABOLIC PANEL - Abnormal; Notable for the following:    Creatinine, Ser 0.59 (*)    Total Protein 8.5 (*)    All other components within normal limits  ETHANOL - Abnormal; Notable for the  following:    Alcohol, Ethyl (B) 115 (*)    All other components within normal limits  ACETAMINOPHEN LEVEL - Abnormal; Notable for the following:    Acetaminophen (Tylenol), Serum <10 (*)    All other components within normal limits  CBC - Abnormal; Notable for the following:    WBC 2.5 (*)    Platelets 64 (*)    All other components within normal limits  SALICYLATE LEVEL  URINE RAPID DRUG SCREEN, HOSP PERFORMED    Imaging Review No results found. I have personally reviewed and evaluated these images and lab results as part of my medical decision-making.   EKG Interpretation None      MDM   Final diagnoses:  Suicidal ideation  Alcohol use (HCC)  Thrombocytopenia (HCC)    20 y.o. male here with SI and plan to drive off overpass. Admits to socially drinking last night, not a chronic alcoholic. No HI/AVH. Pleasant and cooperative. Here voluntarily. Will get screening labs and TTS consultation. Will reassess shortly  9:15 AM UDS neg, CMP WNL, EtOH 115. Salicylate and tylenol levels unremarkable. CBC with chronic baseline thrombocytopenia, no acute findings. Pt medically cleared. Please see TTS/[facility] notes for further documentation of care/dispo. Home meds and psych holding orders placed.  BP  140/108 mmHg  Pulse 81  Temp(Src) 98.2 F (36.8 C) (Oral)  Resp 14  SpO2 100%  Meds ordered this encounter  Medications  . hydroxychloroquine (PLAQUENIL) tablet 200 mg    Sig:   . loratadine (CLARITIN) tablet 10 mg    Sig:   . predniSONE (DELTASONE) tablet 10 mg    Sig:   . mycophenolate (MYFORTIC) EC tablet 360 mg    Sig:   . alum & mag hydroxide-simeth (MAALOX/MYLANTA) 200-200-20 MG/5ML suspension 30 mL    Sig:   . ondansetron (ZOFRAN) tablet 4 mg    Sig:   . zolpidem (AMBIEN) tablet 5 mg    Sig:   . acetaminophen (TYLENOL) tablet 650 mg    Sig:   . LORazepam (ATIVAN) tablet 1 mg    Sig:        Joshia Kitchings Camprubi-Soms, PA-C 02/08/16 [id number]  Lyndal Pulleyaniel Knott,  MD 02/08/16 1730

## 2016-02-08 NOTE — ED Notes (Signed)
Pt discharged home to mother per MD order. This nurse reviewed discharge summary and follow up care with pt. Pt verbalizes understanding. Pt denies SI/HI, Denies AVH. Pt signed e-signature. Pt signed for and given  personal belongings. Pt family in lobby pending discharge. Pt ambulatory off unit.

## 2016-02-08 NOTE — ED Notes (Signed)
Bed: [facility]41 Expected date:  Expected time:  Means of arrival:  Comments: Triage 4 

## 2016-02-08 NOTE — [facility] Suicide Risk Assessment (Signed)
Suicide Risk Assessment  Discharge Assessment   [facility]   Principal Problem: Major depressive disorder, single episode, moderate ([facility]) Discharge Diagnoses:  Patient Active Problem List   Diagnosis Date Noted  . Major depressive disorder, single episode, moderate ([facility]) [F32.1] 02/08/2016    Priority: High  . Alcohol use ([facility]) [Z78.9]   . Lupus ([facility]) [M32.9] 12/27/2014    Total Time spent with patient: 45 minutes  Musculoskeletal: Strength & Muscle Tone: within normal limits Gait & Station: normal Patient leans: N/A  Psychiatric Specialty Exam: Review of Systems  Constitutional: Negative.  HENT: Negative.  Eyes: Negative.  Respiratory: Negative.  Cardiovascular: Negative.  Gastrointestinal: Negative.  Genitourinary: Negative.  Musculoskeletal: Negative.  Skin: Negative.  Neurological: Negative.  Endo/Heme/Allergies: Negative.  Psychiatric/Behavioral: Positive for depression.    Blood pressure 140/108, pulse 81, temperature 98.2 F (36.8 C), temperature source Oral, resp. rate 14, SpO2 100 %.There is no weight on file to calculate BMI.  General Appearance: Casual  Eye Contact:: Fair  Speech: Normal Rate  Volume: Normal  Mood: Depressed  Affect: Congruent  Thought Process: Coherent  Orientation: Full (Time, Place, and Person)  Thought Content: Rumination  Suicidal Thoughts: No  Homicidal Thoughts: No  Memory: Immediate; Good Recent; Good Remote; Good  Judgement: Fair  Insight: Good  Psychomotor Activity: Normal  Concentration: Good  Recall: Good  Fund of Knowledge:Good  Language: Good  Akathisia: No  Handed: Right  AIMS (if indicated):    Assets: Communication Skills Desire for Improvement Financial Resources/Insurance Housing Intimacy Leisure Time Physical Health Resilience Social Support Talents/Skills Transportation Vocational/Educational  ADL's:  Intact  Cognition: WNL  Sleep:          Mental Status Per Nursing Assessment::   On Admission:   Depression with suicidal ideations  Demographic Factors:  Male and Adolescent or young adult  Loss Factors: NA  Historical Factors: NA  Risk Reduction Factors:   Sense of responsibility to family, Living with another person, especially a relative, Positive social support, Positive therapeutic relationship and Positive coping skills or problem solving skills  Continued Clinical Symptoms:  Depression, moderate  Cognitive Features That Contribute To Risk:  None    Suicide Risk:  Minimal: No identifiable suicidal ideation.  Patients presenting with no risk factors but with morbid ruminations; may be classified as minimal risk based on the severity of the depressive symptoms    Plan Of Care/Follow-up recommendations:  Activity:  as tolerated Diet:  heart healthy diet  Madeliene Tejera, NP 02/08/2016, 12:33 PM
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
## 2016-02-08 NOTE — ED Notes (Signed)
PA at bedside.

## 2016-02-08 NOTE — BH Assessment (Addendum)
[facility] Assessment Progress Note  Per Thedore MinsMojeed Akintayo, MD, and Nanine MeansJamison Lord, DNP this pt does not require psychiatric hospitalization at this time.  [facility].  After speaking with the pt and his mother it has been decided that pt would benefit from the Partial Hospitalization Program (PHP) at [facility] [place].   In order to accommodate upcoming final examinations at school, the pt would prefer to contact them at his own initiative rather than having a start date scheduled.  Contact information has been included in pt's discharge instructions, and a notification call has been placed to [facility]; a message left on her voice mail.  Catha NottinghamJamison also asks that contact information for Mobile Crisis be included in pt's discharge instructions.  This has been done.  Pt has also been provided with printed information about PHP and a business card for the [facility] Team.  Pt's nurse, Morrie Sheldonshley, has been notified.  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  Pt requested that I call them and notify them of his ED visit.  At 12:21 I called and spoke to [place]loo[place].  He asks for a copy of pt's discharge instructions to be faxed to him, which has been completed.  Doylene Canninghomas Genevia Bouldin, MA Triage Specialist [phone]

## 2016-03-25 ENCOUNTER — Ambulatory Visit (INDEPENDENT_AMBULATORY_CARE_PROVIDER_SITE_OTHER): Payer: BLUE CROSS/BLUE SHIELD | Admitting: Licensed Clinical Social Worker

## 2016-03-25 ENCOUNTER — Encounter (INDEPENDENT_AMBULATORY_CARE_PROVIDER_SITE_OTHER): Payer: Self-pay

## 2016-03-25 ENCOUNTER — Other Ambulatory Visit (HOSPITAL_COMMUNITY): Payer: BLUE CROSS/BLUE SHIELD

## 2016-03-25 DIAGNOSIS — F321 Major depressive disorder, single episode, moderate: Secondary | ICD-10-CM | POA: Diagnosis not present
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
## 2018-02-08 ENCOUNTER — Encounter (HOSPITAL_COMMUNITY): Payer: Self-pay

## 2018-02-08 ENCOUNTER — Emergency Department (HOSPITAL_COMMUNITY)
Admission: EM | Admit: 2018-02-08 | Discharge: 2018-02-09 | Disposition: A | Discharge status: Home / Self Care | Payer: BLUE CROSS/BLUE SHIELD | Attending: Emergency Medicine | Admitting: Emergency Medicine

## 2018-02-08 DIAGNOSIS — Z5321 Procedure and treatment not carried out due to patient leaving prior to being seen by health care provider: Secondary | ICD-10-CM | POA: Diagnosis not present

## 2018-02-08 DIAGNOSIS — R5383 Other fatigue: Secondary | ICD-10-CM | POA: Insufficient documentation

## 2018-02-08 LAB — CBC WITH DIFFERENTIAL/PLATELET
BASOS PCT: 0 %
Basophils Absolute: 0 10*3/uL (ref 0.0–0.1)
EOS PCT: 1 %
Eosinophils Absolute: 0 10*3/uL (ref 0.0–0.7)
HEMATOCRIT: 40.9 % (ref 39.0–52.0)
Hemoglobin: 13 g/dL (ref 13.0–17.0)
Lymphocytes Relative: 27 %
Lymphs Abs: 1.1 10*3/uL (ref 0.7–4.0)
MCH: 28.1 pg (ref 26.0–34.0)
MCHC: 31.8 g/dL (ref 30.0–36.0)
MCV: 88.3 fL (ref 78.0–100.0)
MONO ABS: 0.3 10*3/uL (ref 0.1–1.0)
MONOS PCT: 6 %
Neutro Abs: 2.6 10*3/uL (ref 1.7–7.7)
Neutrophils Relative %: 66 %
Platelets: 103 10*3/uL — ABNORMAL LOW (ref 150–400)
RBC: 4.63 MIL/uL (ref 4.22–5.81)
RDW: 13.5 % (ref 11.5–15.5)
WBC: 3.9 10*3/uL — ABNORMAL LOW (ref 4.0–10.5)

## 2018-02-08 LAB — BASIC METABOLIC PANEL
Anion gap: 8 (ref 5–15)
BUN: 12 mg/dL (ref 6–20)
CHLORIDE: 104 mmol/L (ref 101–111)
CO2: 29 mmol/L (ref 22–32)
CREATININE: 0.8 mg/dL (ref 0.61–1.24)
Calcium: 8.9 mg/dL (ref 8.9–10.3)
GFR calc Af Amer: >60 mL/min (ref >60)
GFR calc non Af Amer: >60 mL/min (ref >60)
Glucose, Bld: 77 mg/dL (ref 65–99)
Potassium: 3.4 mmol/L — ABNORMAL LOW (ref 3.5–5.1)
Sodium: 141 mmol/L (ref 135–145)

## 2018-02-08 LAB — CBG MONITORING, ED: Glucose-Capillary: 105 mg/dL — ABNORMAL HIGH (ref 65–99)

## 2018-02-08 NOTE — ED Triage Notes (Signed)
Pt states that he has Lupus and has been placed on medications for a while and his rheumatologist wants him to have basic lab work done, due to fatigue, he states he went to [facility] and they did it but he cant get the results for three days

## 2018-02-08 NOTE — ED Notes (Signed)
Pt stated he is leaving. Pt armband cut off

## 2019-08-08 DIAGNOSIS — R002 Palpitations: Secondary | ICD-10-CM | POA: Insufficient documentation

## 2019-09-16 DIAGNOSIS — Z789 Other specified health status: Secondary | ICD-10-CM | POA: Insufficient documentation

## 2019-09-16 DIAGNOSIS — F1010 Alcohol abuse, uncomplicated: Secondary | ICD-10-CM | POA: Insufficient documentation

## 2020-09-25 ENCOUNTER — Encounter (HOSPITAL_COMMUNITY): Payer: Self-pay

## 2020-09-27 ENCOUNTER — Ambulatory Visit: Payer: Self-pay | Attending: Internal Medicine

## 2020-09-27 DIAGNOSIS — Z23 Encounter for immunization: Secondary | ICD-10-CM

## 2020-09-27 NOTE — Progress Notes (Signed)
   Covid-19 Vaccination Clinic  Name:  Chase Townsend    MRN: [id number] DOB: 1996/06/25  09/27/2020  [name] was observed post Covid-19 immunization for 15 minutes without incident. He was provided with Vaccine Information Sheet and instruction to access the V-Safe system.   [name] was instructed to call 911 with any severe reactions post vaccine: Marland Kitchen Difficulty breathing  . Swelling of face and throat  . A fast heartbeat  . A bad rash all over body  . Dizziness and weakness   Immunizations Administered    Name Date Dose VIS Date Route   Moderna COVID-19 Vaccine 09/27/2020  2:32 PM 0.5 mL 07/25/2020 Intramuscular   Manufacturer: Gala Murdoch   Lot: [id number]   NDC: 10071-219-75

## 2020-12-10 DIAGNOSIS — Z113 Encounter for screening for infections with a predominantly sexual mode of transmission: Secondary | ICD-10-CM | POA: Diagnosis not present

## 2020-12-10 DIAGNOSIS — Z79899 Other long term (current) drug therapy: Secondary | ICD-10-CM | POA: Diagnosis not present

## 2020-12-10 DIAGNOSIS — M329 Systemic lupus erythematosus, unspecified: Secondary | ICD-10-CM | POA: Diagnosis not present

## 2020-12-14 ENCOUNTER — Other Ambulatory Visit (HOSPITAL_BASED_OUTPATIENT_CLINIC_OR_DEPARTMENT_OTHER): Payer: Self-pay

## 2020-12-14 MED ORDER — BENLYSTA 200 MG/ML ~~LOC~~ SOAJ
SUBCUTANEOUS | 3 refills | Status: DC
  Filled 2020-12-14: qty 12, 84d supply, fill #0

## 2020-12-17 ENCOUNTER — Telehealth: Payer: Self-pay | Admitting: Pharmacist

## 2020-12-17 NOTE — Telephone Encounter (Signed)
Called patient to schedule an appointment for the [employer] [facility]. I was unable to reach the patient so I left a HIPAA-compliant message requesting that the patient return my call.   Luke Van Ausdall, PharmD, BCACP, CPP Clinical Pharmacist [facility] [phone]  

## 2020-12-24 ENCOUNTER — Ambulatory Visit (HOSPITAL_BASED_OUTPATIENT_CLINIC_OR_DEPARTMENT_OTHER): Payer: Self-pay | Admitting: Family Medicine

## 2020-12-31 ENCOUNTER — Other Ambulatory Visit: Payer: Self-pay

## 2020-12-31 ENCOUNTER — Other Ambulatory Visit: Payer: Self-pay | Admitting: Pharmacist

## 2020-12-31 ENCOUNTER — Ambulatory Visit: Payer: 59 | Attending: Family Medicine | Admitting: Pharmacist

## 2020-12-31 DIAGNOSIS — Z79899 Other long term (current) drug therapy: Secondary | ICD-10-CM

## 2020-12-31 MED ORDER — BENLYSTA 200 MG/ML ~~LOC~~ SOAJ: SUBCUTANEOUS | 3 refills | Status: DC

## 2020-12-31 NOTE — Progress Notes (Signed)
   S: Patient presents today for review of their specialty medication.   Patient is getting ready to start taking Benlysta for SLE. Patient is managed by [facility] for this.   Dosing: 200 mg subQ every 7 days  Adherence: has not yet restarted the medication  Efficacy: has taken the medication before. Stopped d/t Solicitor. Is about to restart therapy  Monitoring:  - S/sx of hypersensitivity: none when on medication previously  - S/sx of infections: none currently - Depression/mood changes: none when on medication previously  - S/sx of malignancy: none when on medication previously - PML/neurological symptoms: none when on medication previously   Current adverse effects: - GI side effects: none when on medication previously - Dermatological reaction: pt does endorse facial "breakouts" with initial injection. Reports that this subsided when on therapy previously.  - Other side effects: some hair loss initially that subsided when on therapy previously   O:     Lab Results  Component Value Date   WBC 3.9 (L) 02/08/2018   HGB 13.0 02/08/2018   HCT 40.9 02/08/2018   MCV 88.3 02/08/2018   PLT 103 (L) 02/08/2018      Chemistry      Component Value Date/Time   NA 141 02/08/2018 2001   K 3.4 (L) 02/08/2018 2001   CL 104 02/08/2018 2001   CO2 29 02/08/2018 2001   BUN 12 02/08/2018 2001   CREATININE 0.80 02/08/2018 2001      Component Value Date/Time   CALCIUM 8.9 02/08/2018 2001   ALKPHOS 67 02/08/2016 0820   AST 32 02/08/2016 0820   ALT 25 02/08/2016 0820   BILITOT 1.0 02/08/2016 0820       A/P: 1. Medication review: patient is getting ready to restart Benlysta for SLE. He had good results with minimal adverse effects when on the medication previously. He started the medication in 2019 per  Care Everywhere. This was stopped d/t being between insurances. Per RA at [facility], he is taking oral prednisone at this time but should be on Benlysta and  hydroxychloroquine as well. The plan is to restart these and attempt pred taper in the future. Reviewed the Benlysta with the patient. He has no questions or concerns at this time. Possible adverse effects and required monitoring covered with the patient. No recommendations for any changes at this time.   Butch Penny, PharmD, P[place]y [place], CPP Clinical Pharmacist [facility] [phone]

## 2021-01-02 ENCOUNTER — Encounter (HOSPITAL_BASED_OUTPATIENT_CLINIC_OR_DEPARTMENT_OTHER): Payer: Self-pay | Admitting: Family Medicine

## 2021-01-02 ENCOUNTER — Ambulatory Visit (HOSPITAL_BASED_OUTPATIENT_CLINIC_OR_DEPARTMENT_OTHER): Payer: 59 | Admitting: Family Medicine

## 2021-01-02 ENCOUNTER — Other Ambulatory Visit: Payer: Self-pay

## 2021-01-02 DIAGNOSIS — J019 Acute sinusitis, unspecified: Secondary | ICD-10-CM | POA: Insufficient documentation

## 2021-01-02 DIAGNOSIS — J0100 Acute maxillary sinusitis, unspecified: Secondary | ICD-10-CM

## 2021-01-02 DIAGNOSIS — I10 Essential (primary) hypertension: Secondary | ICD-10-CM

## 2021-01-02 DIAGNOSIS — B9689 Other specified bacterial agents as the cause of diseases classified elsewhere: Secondary | ICD-10-CM | POA: Insufficient documentation

## 2021-01-02 MED ORDER — CETIRIZINE HCL 10 MG PO TABS
10.0000 mg | ORAL_TABLET | Freq: Every day | ORAL | 1 refills | Status: DC
Start: 2021-01-02 — End: 2021-05-06

## 2021-01-02 NOTE — Progress Notes (Signed)
New Patient Office Visit  Subjective:  Patient ID: Chase Townsend, male    DOB: 25-Dec-1995  Age: 25 y.o. MRN: [id number]  CC:  Chief Complaint  Patient presents with  . Establish Care  . Cough  . URI    Onset of symptoms 3 days ago.    HPI Chase Townsend is a 25 year old male presenting to establish in clinic.  Current concerns today include sinus infection.  Past medical history significant for SLE, hypertension.  Sinus infection: Reports that symptoms started over the weekend including cough, sinus pressure, runny nose, purulent nasal discharge, chest tightness.  Has been using Sudafed, Flonase, Claritin, Benadryl to help with symptoms.  Denies any fevers, new body aches.  Occasional cough productive of sputum.  SLE: Follows with rheumatology.  Currently taking prednisone, but in the process of being transitioned to [facility].  Also taking hydroxychloroquine currently.  Next appointment rheumatology is in April.  Working with specialty pharmacy through current health in regards to the [place].  Hypertension: Has previously seen cardiology but it has been over a year since his last visit with him.  Was started on carvedilol, this was increased a few weeks ago by his rheumatologist due to continued elevated blood pressure.  Reports occasional chest pain which patient had at time of evaluation cardiology with normal EKG.  On chart review, it appears an echocardiogram is planned at one point however this has not been completed.  Denies any vision issues or lightheadedness/dizziness.  Will be following up with cardiology in May [facility]).  Past Medical History:  Diagnosis Date  . Allergy   . Arthritis   . Clotting disorder (HCC)   . Depression   . Lupus [facility])     Past Surgical History:  Procedure Laterality Date  . COSMETIC SURGERY    . LYMPH NODE BIOPSY    . TONSILLECTOMY      Family History  Problem Relation Age of Onset  . Stroke Maternal Grandmother    . Cancer Maternal Grandmother   . Diabetes Maternal Grandmother   . Hyperlipidemia Maternal Grandfather     Social History   Socioeconomic History  . Marital status: Married    Spouse name: Not on file  . Number of children: Not on file  . Years of education: Not on file  . Highest education level: Not on file  Occupational History  . Not on file  Tobacco Use  . Smoking status: Never Smoker  . Smokeless tobacco: Never Used  Substance and Sexual Activity  . Alcohol use: Yes    Alcohol/week: 4.0 standard drinks    Types: 4 Standard drinks or equivalent per week  . Drug use: No  . Sexual activity: Yes  Other Topics Concern  . Not on file  Social History Narrative   ** Merged History Encounter **       Social Determinants of Health   Financial Resource Strain: Not on file  Food Insecurity: Not on file  Transportation Needs: Not on file  Physical Activity: Not on file  Stress: Not on file  Social Connections: Not on file  Intimate Partner Violence: Not on file    Objective:   Today's Vitals: BP (!) 160/110   Pulse 87   Ht 5\' 7"  (1.702 m)   Wt 153 lb (69.4 kg)   SpO2 98%   BMI 23.96 kg/m   Physical Exam  Pleasant 25 year old male in no acute distress H EENT exam: Normocephalic atraumatic, mild tenderness through frontal  sinuses, moderate tenderness to palpation over maxillary sinuses bilaterally Cardiovascular exam with regular rate and rhythm, no murmur appreciated Lungs clear to auscultation bilaterally  Assessment & Plan:   Problem List Items Addressed This Visit      Cardiovascular and Mediastinum   Hypertension    Chronic, not at goal Blood pressure elevated in the office today, this could be related to current acute illness, side effects of OTC medication including Sudafed, possibly related to prednisone, and adequate control Patient will be following up with cardiologist at the beginning of May, encouraged to keep this appointment Encouraged to  continue monitoring blood pressure at home and keeping a log for cardiologist to review Continue with carvedilol, uncertain if this is the ideal medication for patients blood pressure to be managed with in setting of SLE and associated pharmacotherapy; ideally, blood pressure will be treated with calcium channel blocker or thiazide diuretic Discussed lifestyle modifications including recommendations for limiting salt intake, increasing aerobic exercise      Relevant Medications   carvedilol (COREG) 12.5 MG tablet     Respiratory   Acute sinus infection    Suspect viral etiology at this time Discussed supportive measures including intranasal steroid, nasal saline spray, antihistamine, OTC medications to assist in control of symptoms including pressure/headaches Hold off on antibiotics for now, discussed that if symptoms persist beyond 7 to 10 days, increase likelihood of bacterial infection and thus consider antibiotic initiation at that time Continue remain hydrated, ensure adequate rest at night Return to the office for any worsening symptoms      Relevant Medications   cetirizine (ZYRTEC) 10 MG tablet      Outpatient Encounter Medications as of 01/02/2021  Medication Sig  . cetirizine (ZYRTEC) 10 MG tablet Take 1 tablet (10 mg total) by mouth at bedtime.  Marland Kitchen acetaminophen (TYLENOL) 650 MG CR tablet Take 650 mg by mouth every 8 (eight) hours as needed for pain.  . Belimumab (BENLYSTA) 200 MG/ML SOAJ Inject 200 mg subcutaneously every 7 (seven) days  . carvedilol (COREG) 12.5 MG tablet Take 12.5 mg by mouth 2 (two) times daily.  . hydroxychloroquine (PLAQUENIL) 200 MG tablet Take 200 mg by mouth daily.   Marland Kitchen loratadine (CLARITIN) 10 MG tablet Take 10 mg by mouth daily as needed for allergies.   . predniSONE (DELTASONE) 5 MG tablet Take 5 mg by mouth daily with breakfast.   . traMADol (ULTRAM) 50 MG tablet Take 50 mg by mouth as needed.   No facility-administered encounter medications on  file as of 01/02/2021.   Spent 60 minutes on this patient encounter, including preparation, chart review, face-to-face counseling with patient and coordination of care, and documentation of encounter  Follow-up: Return in about 4 months (around 05/04/2021) for Follow Up.   Esau Fridman J De Peru, MD

## 2021-01-02 NOTE — Patient Instructions (Signed)
  Medication Instructions:  Your physician has recommended you make the following change in your medication:  -- A prescritpion has been sent in for Zyrtec --  --If you need a refill on any your medications before your next appointment, please call your pharmacy first. If no refills are authorized on file call the office.--   Follow-Up: Your next appointment:   Your physician recommends that you schedule a follow-up appointment in: 4 MONTHS with Dr. De Peru  Thanks for letting us be apart of your health journey!!  Primary Care and Sports Medicine   Dr. de Peru and Shawna Clamp, DNP, AGNP  We recommend signing up for the patient portal called "MyChart".  Sign up information is provided on this After Visit Summary.  MyChart is used to connect with patients for Virtual Visits (Telemedicine).  Patients are able to view lab/test results, encounter notes, upcoming appointments, etc.  Non-urgent messages can be sent to your provider as well.   To learn more about what you can do with MyChart, go to [email or web address].

## 2021-01-02 NOTE — Assessment & Plan Note (Addendum)
Suspect viral etiology at this time Discussed supportive measures including intranasal steroid, nasal saline spray, antihistamine, OTC medications to assist in control of symptoms including pressure/headaches Medication options are limited in some respects due to elevated blood pressure Hold off on antibiotics for now, discussed that if symptoms persist beyond 7 to 10 days, increase likelihood of bacterial infection and thus consider antibiotic initiation at that time Continue to remain hydrated, ensure adequate rest at night Return to the office for any worsening symptoms

## 2021-01-02 NOTE — Assessment & Plan Note (Signed)
Chronic, not at goal Blood pressure elevated in the office today, this could be related to current acute illness, side effects of OTC medication including Sudafed, possibly related to prednisone, and adequate control Patient will be following up with cardiologist at the beginning of May, encouraged to keep this appointment Encouraged to continue monitoring blood pressure at home and keeping a log for cardiologist to review Continue with carvedilol, uncertain if this is the ideal medication for patients blood pressure to be managed with in setting of SLE and associated pharmacotherapy; ideally, blood pressure will be treated with calcium channel blocker or thiazide diuretic Discussed lifestyle modifications including recommendations for limiting salt intake, increasing aerobic exercise

## 2021-01-03 ENCOUNTER — Other Ambulatory Visit (HOSPITAL_BASED_OUTPATIENT_CLINIC_OR_DEPARTMENT_OTHER): Payer: Self-pay

## 2021-01-06 ENCOUNTER — Encounter (HOSPITAL_BASED_OUTPATIENT_CLINIC_OR_DEPARTMENT_OTHER): Payer: Self-pay | Admitting: Family Medicine

## 2021-01-08 ENCOUNTER — Ambulatory Visit (HOSPITAL_BASED_OUTPATIENT_CLINIC_OR_DEPARTMENT_OTHER): Payer: 59 | Admitting: Family Medicine

## 2021-01-08 ENCOUNTER — Other Ambulatory Visit: Payer: Self-pay

## 2021-01-08 ENCOUNTER — Encounter (HOSPITAL_BASED_OUTPATIENT_CLINIC_OR_DEPARTMENT_OTHER): Payer: Self-pay | Admitting: Family Medicine

## 2021-01-08 DIAGNOSIS — B9689 Other specified bacterial agents as the cause of diseases classified elsewhere: Secondary | ICD-10-CM

## 2021-01-08 DIAGNOSIS — H109 Unspecified conjunctivitis: Secondary | ICD-10-CM | POA: Insufficient documentation

## 2021-01-08 DIAGNOSIS — B309 Viral conjunctivitis, unspecified: Secondary | ICD-10-CM

## 2021-01-08 DIAGNOSIS — J019 Acute sinusitis, unspecified: Secondary | ICD-10-CM | POA: Diagnosis not present

## 2021-01-08 MED ORDER — KETOTIFEN FUMARATE 0.025 % OP SOLN: 1.0000 [drp] | Freq: Two times a day (BID) | OPHTHALMIC | 0 refills | Status: DC

## 2021-01-08 MED ORDER — DOXYCYCLINE HYCLATE 100 MG PO TABS: 100.0000 mg | ORAL_TABLET | Freq: Two times a day (BID) | ORAL | 0 refills | Status: AC

## 2021-01-08 NOTE — Assessment & Plan Note (Signed)
Given duration of symptoms, likely bacterial infection Given reported allergy to amoxicillin, will treat with doxycycline 1 mg twice daily Can continue with conservative measures to help with symptom relief including OTC medications, nasal saline, nasal steroid sprays Encouraged proper precautions related to infection, hand hygiene

## 2021-01-08 NOTE — Patient Instructions (Signed)
  Medication Instructions:  Your physician has recommended you make the following change in your medication:  -- START Doxycycline 100 mg - Take 1 tablet by mouth twice daily for 7 days -- START Ketotifen eye drops -  Place 1 drop into left eye twice daily until symptoms resolve --If you need a refill on any your medications before your next appointment, please call your pharmacy first. If no refills are authorized on file call the office.--  Follow-Up: Your next appointment:   Your physician recommends that you keep your scheduled follow-up appointment in August 2022 with Dr. De Peru   Thanks for letting us be apart of your health journey!!  Primary Care and Sports Medicine   Dr. de Peru and Shawna Clamp, DNP, AGNP  We recommend signing up for the patient portal called "MyChart".  Sign up information is provided on this After Visit Summary.  MyChart is used to connect with patients for Virtual Visits (Telemedicine).  Patients are able to view lab/test results, encounter notes, upcoming appointments, etc.  Non-urgent messages can be sent to your provider as well.   To learn more about what you can do with MyChart, go to [email or web address].

## 2021-01-08 NOTE — Progress Notes (Signed)
    Procedures performed today:    None.  Independent interpretation of notes and tests performed by another provider:   None.  Brief History, Exam, Impression, and Recommendations:    25 year old male with continued symptoms of sinus congestion, cough.  Previously seen last week for similar symptoms which have not resolved.  Denies any fevers, chills, night sweats.  Has also developed eye redness with discharge.  Notes crusting of the eyes in the morning when he wakes up, will have watery discharge during the day and in the evening.  Denies any change in visual acuity.  Will have feeling of grittiness in the eye during the day.  Denies any significant pain.  Has been using OTC eyedrops -Similasan.  BP (!) 130/94   Pulse (!) 106   Ht 5\' 7"  (1.702 m)   Wt 149 lb 6.4 oz (67.8 kg)   SpO2 98%   BMI 23.40 kg/m   Pleasant 25 year old male in no acute distress Left eye with mild conjunctival injection, watery discharge, no crusting present currently.  Normal extraocular movements, pupil round, reactive to light Tenderness to palpation most predominant over maxillary sinuses, mildly over frontal sinuses  Acute bacterial sinusitis Given duration of symptoms, likely bacterial infection Given reported allergy to amoxicillin, will treat with doxycycline 1 mg twice daily Can continue with conservative measures to help with symptom relief including OTC medications, nasal saline, nasal steroid sprays Encouraged proper precautions related to infection, hand hygiene  Conjunctivitis Suspect viral conjunctivitis based on history and exam Recommend eyedrops to provide symptom relief, prescription sent for ketotifen eyedrops to compare prescription cost versus OTC cost Encourage proper precautions related to infection, hand hygiene  Spent 30 minutes on this patient encounter, including preparation, chart review, face-to-face counseling with patient and coordination of care, and documentation of  encounter  Plan for follow-up at next scheduled appointment or sooner as needed  ___________________________________________ Chase Townsend de 32, MD, ABFM, CAQSM Primary Care and Sports Medicine [facility]

## 2021-01-08 NOTE — Assessment & Plan Note (Signed)
Suspect viral conjunctivitis based on history and exam Recommend eyedrops to provide symptom relief, prescription sent for ketotifen eyedrops to compare prescription cost versus OTC cost Encourage proper precautions related to infection, hand hygiene

## 2021-01-10 ENCOUNTER — Other Ambulatory Visit (HOSPITAL_COMMUNITY): Payer: Self-pay

## 2021-01-10 MED FILL — Belimumab Subcutaneous Solution Auto-injector 200 MG/ML: SUBCUTANEOUS | 28 days supply | Qty: 4 | Fill #0 | Status: AC

## 2021-01-11 ENCOUNTER — Other Ambulatory Visit (HOSPITAL_COMMUNITY): Payer: Self-pay

## 2021-01-14 ENCOUNTER — Other Ambulatory Visit (HOSPITAL_COMMUNITY): Payer: Self-pay

## 2021-01-15 ENCOUNTER — Other Ambulatory Visit (HOSPITAL_BASED_OUTPATIENT_CLINIC_OR_DEPARTMENT_OTHER): Payer: Self-pay

## 2021-01-15 ENCOUNTER — Ambulatory Visit: Payer: 59 | Attending: Internal Medicine

## 2021-01-15 DIAGNOSIS — Z23 Encounter for immunization: Secondary | ICD-10-CM

## 2021-01-15 MED ORDER — COVID-19 MRNA VACC (MODERNA) 100 MCG/0.5ML IM SUSP
INTRAMUSCULAR | 0 refills | Status: DC
  Filled 2021-01-15: qty 0.25, 1d supply, fill #0

## 2021-01-15 NOTE — Progress Notes (Signed)
   Covid-19 Vaccination Clinic  Name:  Chase Townsend    MRN: [id number] DOB: 08-23-1996  01/15/2021  [name] was observed post Covid-19 immunization for 15 minutes without incident. He was provided with Vaccine Information Sheet and instruction to access the V-Safe system.   [name] was instructed to call 911 with any severe reactions post vaccine: Marland Kitchen Difficulty breathing  . Swelling of face and throat  . A fast heartbeat  . A bad rash all over body  . Dizziness and weakness   Immunizations Administered    Name Date Dose VIS Date Route   Moderna Covid-19 Booster Vaccine 01/15/2021  2:22 PM 0.25 mL 07/25/2020 Intramuscular   Manufacturer: Gala Murdoch   Lot: [id number]   NDC: 55208-022-33

## 2021-01-30 ENCOUNTER — Other Ambulatory Visit (HOSPITAL_COMMUNITY): Payer: Self-pay

## 2021-02-04 ENCOUNTER — Other Ambulatory Visit (HOSPITAL_COMMUNITY): Payer: Self-pay

## 2021-02-04 MED FILL — Belimumab Subcutaneous Solution Auto-injector 200 MG/ML: SUBCUTANEOUS | 28 days supply | Qty: 4 | Fill #1 | Status: CN

## 2021-02-05 ENCOUNTER — Other Ambulatory Visit (HOSPITAL_COMMUNITY): Payer: Self-pay

## 2021-02-23 ENCOUNTER — Other Ambulatory Visit (HOSPITAL_COMMUNITY): Payer: Self-pay

## 2021-03-05 ENCOUNTER — Other Ambulatory Visit (HOSPITAL_COMMUNITY): Payer: Self-pay

## 2021-03-05 MED FILL — Belimumab Subcutaneous Solution Auto-injector 200 MG/ML: SUBCUTANEOUS | 28 days supply | Qty: 4 | Fill #1 | Status: CN

## 2021-03-11 ENCOUNTER — Ambulatory Visit (HOSPITAL_BASED_OUTPATIENT_CLINIC_OR_DEPARTMENT_OTHER): Payer: 59 | Admitting: Family Medicine

## 2021-03-13 ENCOUNTER — Ambulatory Visit (HOSPITAL_BASED_OUTPATIENT_CLINIC_OR_DEPARTMENT_OTHER): Payer: 59 | Admitting: Family Medicine

## 2021-03-13 ENCOUNTER — Other Ambulatory Visit: Payer: Self-pay

## 2021-03-13 ENCOUNTER — Encounter (HOSPITAL_BASED_OUTPATIENT_CLINIC_OR_DEPARTMENT_OTHER): Payer: Self-pay | Admitting: Family Medicine

## 2021-03-13 ENCOUNTER — Other Ambulatory Visit (HOSPITAL_COMMUNITY): Payer: Self-pay

## 2021-03-13 VITALS — BP 142/110 | HR 83 | Ht 67.0 in | Wt 139.0 lb

## 2021-03-13 DIAGNOSIS — I10 Essential (primary) hypertension: Secondary | ICD-10-CM

## 2021-03-13 DIAGNOSIS — M545 Low back pain, unspecified: Secondary | ICD-10-CM

## 2021-03-13 MED FILL — Belimumab Subcutaneous Solution Auto-injector 200 MG/ML: SUBCUTANEOUS | 28 days supply | Qty: 4 | Fill #1 | Status: AC

## 2021-03-13 NOTE — Patient Instructions (Signed)
  Medication Instructions:  Your physician recommends that you continue on your current medications as directed. Please refer to the Current Medication list given to you today. --If you need a refill on any your medications before your next appointment, please call your pharmacy first. If no refills are authorized on file call the office.--  Referrals/Procedures/Imaging: A referral has been placed for you to go to Outpatient Physical Therapy at [facility] Someone from the scheduling department will be in contact with you in regards to coordinating your consultation. If you do not hear from any of the schedulers within 7-10 business days please give our office a call.  [facility] at [facility] on the 1st Floor Hours of Operation: Monday - Friday 8:00 am - 5:00 pm Closed on weekends and all major holidays (P) [phone]  Follow-Up: Your next appointment:   Your physician recommends that you schedule a follow-up appointment in: 6 WEEKS with Dr. de Peru  Thanks for letting us be apart of your health journey!!  Primary Care and Sports Medicine   Dr. de Peru and Shawna Clamp, DNP, AGNP  We recommend signing up for the patient portal called "MyChart".  Sign up information is provided on this After Visit Summary.  MyChart is used to connect with patients for Virtual Visits (Telemedicine).  Patients are able to view lab/test results, encounter notes, upcoming appointments, etc.  Non-urgent messages can be sent to your provider as well.   To learn more about what you can do with MyChart, please visit --  [email or web address].

## 2021-03-13 NOTE — Assessment & Plan Note (Signed)
Pain initially began about 2 months ago, no inciting event.  Denies any prior similar episodes.  Pain is mostly in low back, bilaterally.  Denies any radiation of pain into lower extremities.  Denies any numbness or tingling or bowel or bladder issues.  Nothing in particular seems to make the pain worse necessarily.  Has been utilizing heat and Tylenol to help with symptoms.  Will have intermittent cracking/popping of the back which is not painful. History and exam without any red flags Recommend continue with conservative treatment including OTC medications, topical treatments as needed Recommend referral to physical therapy, home exercise program as per PT Plan for follow-up in about 6 weeks to monitor progress If not proving expected, consider imaging

## 2021-03-13 NOTE — Progress Notes (Signed)
    Procedures performed today:    None.  Independent interpretation of notes and tests performed by another provider:   None.  Brief History, Exam, Impression, and Recommendations:    BP (!) 142/110   Pulse 83   Ht 5\' 7"  (1.702 m)   Wt 139 lb (63 kg)   SpO2 96%   BMI 21.77 kg/m   Lumbar spine: Visual inspection without obvious abnormality No tenderness to palpation over spinous processes, mild tenderness palpation through paraspinal musculature bilaterally Normal forward flexion, no pain elicited.  Normal back extension, mild pain elicited. Normal trunk rotation range of motion bilaterally  Hypertension Patient was scheduled to establish with cardiology in May, however cardiology office had to reschedule.  Patient not sure when this was supposed to be moved to, he will be checking with the cardiology office regarding rescheduling of this appointment.  Denies any issues with chest pain, shortness of breath, headaches. Blood pressure elevated in office today, will recheck prior to patient leaving the office Current elevation could be related to ongoing pain related to above Continue with present dose of carvedilol, working on lifestyle modifications Encourage patient to establish with cardiology  Low back pain Pain initially began about 2 months ago, no inciting event.  Denies any prior similar episodes.  Pain is mostly in low back, bilaterally.  Denies any radiation of pain into lower extremities.  Denies any numbness or tingling or bowel or bladder issues.  Nothing in particular seems to make the pain worse necessarily.  Has been utilizing heat and Tylenol to help with symptoms.  Will have intermittent cracking/popping of the back which is not painful. History and exam without any red flags Recommend continue with conservative treatment including OTC medications, topical treatments as needed Recommend referral to physical therapy, home exercise program as per PT Plan for  follow-up in about 6 weeks to monitor progress If not proving expected, consider imaging   ___________________________________________ Maysel Mccolm de June, MD, ABFM, CAQSM Primary Care and Sports Medicine [facility]

## 2021-03-13 NOTE — Assessment & Plan Note (Addendum)
Patient was scheduled to establish with cardiology in May, however cardiology office had to reschedule.  Patient not sure when this was supposed to be moved to, he will be checking with the cardiology office regarding rescheduling of this appointment.  Denies any issues with chest pain, shortness of breath, headaches. Blood pressure elevated in office today, will recheck prior to patient leaving the office Current elevation could be related to ongoing pain related to above Continue with present dose of carvedilol, working on lifestyle modifications Encourage patient to establish with cardiology

## 2021-03-14 ENCOUNTER — Other Ambulatory Visit (HOSPITAL_COMMUNITY): Payer: Self-pay

## 2021-03-29 ENCOUNTER — Other Ambulatory Visit (HOSPITAL_COMMUNITY): Payer: Self-pay

## 2021-04-01 ENCOUNTER — Other Ambulatory Visit (HOSPITAL_COMMUNITY): Payer: Self-pay

## 2021-04-02 ENCOUNTER — Other Ambulatory Visit (HOSPITAL_COMMUNITY): Payer: Self-pay

## 2021-04-03 ENCOUNTER — Other Ambulatory Visit (HOSPITAL_COMMUNITY): Payer: Self-pay

## 2021-04-10 ENCOUNTER — Ambulatory Visit (HOSPITAL_BASED_OUTPATIENT_CLINIC_OR_DEPARTMENT_OTHER): Payer: 59 | Admitting: Physical Therapy

## 2021-04-17 ENCOUNTER — Other Ambulatory Visit (HOSPITAL_COMMUNITY): Payer: Self-pay

## 2021-04-17 ENCOUNTER — Ambulatory Visit (HOSPITAL_BASED_OUTPATIENT_CLINIC_OR_DEPARTMENT_OTHER): Payer: 59 | Admitting: Physical Therapy

## 2021-04-18 ENCOUNTER — Ambulatory Visit (HOSPITAL_BASED_OUTPATIENT_CLINIC_OR_DEPARTMENT_OTHER): Payer: 59 | Attending: Family Medicine | Admitting: Physical Therapy

## 2021-04-18 ENCOUNTER — Encounter (HOSPITAL_BASED_OUTPATIENT_CLINIC_OR_DEPARTMENT_OTHER): Payer: Self-pay | Admitting: Physical Therapy

## 2021-04-18 ENCOUNTER — Other Ambulatory Visit: Payer: Self-pay

## 2021-04-18 DIAGNOSIS — M6281 Muscle weakness (generalized): Secondary | ICD-10-CM | POA: Diagnosis not present

## 2021-04-18 DIAGNOSIS — M545 Low back pain, unspecified: Secondary | ICD-10-CM | POA: Insufficient documentation

## 2021-04-18 NOTE — Patient Instructions (Signed)
Access Code: [email or web address] URL: [email or web address] Date: 04/18/2021 Prepared by: Zebedee Iba  Exercises Sidelying Open Book - 2 x daily - 7 x weekly - 1 sets - 15 reps - 5 hold Cat Cow - 2 x daily - 7 x weekly - 1 sets - 15 reps - 5 hold DNS Bug Bracing March - 2 x daily - 7 x weekly - 2 sets - 10 reps Standing Anti-Rotation Press with Anchored Resistance - 2 x daily - 7 x weekly - 3 sets - 15 reps

## 2021-04-18 NOTE — Therapy (Addendum)
[facility] [address] [place], [place], [id number] Phone: [phone]   Fax:  [phone]  Physical Therapy Evaluation/Discharge  Patient Details  Name: Chase QuintonGerrell Hailu MRN: [id number] Date of Birth: 10/11/1995 Referring Provider (PT): de Peruuba, Buren Kosaymond J, MD   Encounter Date: 04/18/2021   PT End of Session - 04/18/21 1715     Visit Number 1    Number of Visits 9    Date for PT Re-Evaluation 07/17/21    Authorization Type [employer]    PT Start Time 1600    PT Stop Time 1645    PT Time Calculation (min) 45 min    Activity Tolerance Patient tolerated treatment well    Behavior During Therapy [facility] for tasks assessed/performed             Past Medical History:  Diagnosis Date   Allergy    Arthritis    Clotting disorder (HCC)    Depression    Lupus (HCC)     Past Surgical History:  Procedure Laterality Date   COSMETIC SURGERY     LYMPH NODE BIOPSY     TONSILLECTOMY      There were no vitals filed for this visit.    Subjective Assessment - 04/18/21 1608     Subjective Pt states the pain started happeneing after his daughter was born. He had the pain before but it is more consistent now. ~3 months. He has been bending over to lift her and pick her up more recently. Pt states he might think it might from his lupus and was unsure of origin. Pain is "dull and stiff." He will "crack" his back while at work and throughout the day to give relief. Pt denies specific injury or MOI. Pt locates pain from tailbone to just below shoulder blades. Pt has constant pain. He will occasionally lupus flare ups that cause generalized body pain. Aggs: lupus flare ups; Eases:Tramadol, Tylenol. Pt denies NT outside of lupus NT in hands. Pt denies BB function. Pt denies drop attacks. 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 Pt is fairly sedentary outside of work. Pt will occasionally go to the gym at  [employer] (mix of cardio and some weight machines.) Current 0/10, Worst 8/10.    How long can you sit comfortably? N/A    How long can you stand comfortably? N/A    How long can you walk comfortably? N/A    Diagnostic tests N/A    Patient Stated Goals Pt wants to determine cause of pain. Pt would like to decrease pain.    Currently in Pain? No/denies    Multiple Pain Sites No                [facility] PT Assessment - 04/18/21 0001       Assessment   Medical Diagnosis M54.50 (ICD-10-CM) - Acute bilateral low back pain without sciatica    Referring Provider (PT) de Peruuba, Buren Kosaymond J, MD    Prior Therapy N/A      Precautions   Precautions None      Restrictions   Weight Bearing Restrictions No      Balance Screen   Has the patient fallen in the past 6 months No      Home Environment   Living Environment Private residence      Prior Function   Level of Independence Independent      Cognition   Overall Cognitive Status Within Functional Limits  for tasks assessed      Observation/Other Assessments   Other Surveys  Oswestry Disability Index    Oswestry Disability Index  5/50 10%      Functional Tests   Functional tests Squat;Sit to Stand;Lunges      Squat   Comments decreased DF ROM, increased trunk lean, L/S rounding at end range      Lunges   Comments decreased L fwd lunge stability, increased trunk sway      Sit to Stand   Comments [facility]      Posture/Postural Control   Posture/Postural Control Postural limitations    Postural Limitations Decreased lumbar lordosis      ROM / Strength   AROM / PROM / Strength AROM;PROM;Strength      AROM   Overall AROM Comments L/S WNL, pain with end range extension      PROM   Overall PROM Comments bilateral hips WFL, stiffness into IR      Strength   Overall Strength Comments bilateral hip 4+/5, rectus abdominus 4/5      Flexibility   Soft Tissue Assessment /Muscle Length yes    Hamstrings limited supine 90/90    Quadratus  Lumborum taut to palpation      Palpation   SI assessment  L/S extension stiffness with CPA    Palpation comment hypertonicity of bilateral L/S paraspinal and QL, TTP only on L      Special Tests    Special Tests Lumbar;Hip Special Tests    Lumbar Tests Straight Leg Raise    Hip Special Tests  Luisa Hart (FABER) Test;Anterior Hip Impingement Test      Straight Leg Raise   Findings Negative      Luisa Hart (FABER) Test   Findings Negative      Anterior Hip Impingement Test    Findings Negative      Ambulation/Gait   Ambulation Distance (Feet) 90 Feet    Gait Pattern Within Functional Limits                        Objective measurements completed on examination: See above findings.       [facility] Adult PT Treatment/Exercise - 04/18/21 0001       Exercises   Exercises Lumbar      Lumbar Exercises: Standing   Other Standing Lumbar Exercises Paloff press 10x GTB each way      Lumbar Exercises: Supine   Other Supine Lumbar Exercises Deadbug (LE only) 10x each      Lumbar Exercises: Sidelying   Other Sidelying Lumbar Exercises open book stretch 10x 5s      Lumbar Exercises: Quadruped   Madcat/Old Horse Limitations 10x 5s      Manual Therapy   Manual Therapy Joint mobilization;Soft tissue mobilization    Joint Mobilization L L2-5 UPA grade III    Soft tissue mobilization bilat L/S paraspinal and QL                    PT Education - 04/18/21 1714     Education Details MOI, diagnosis, prognosis, anatomy, exercise progression, DOMS expectations, muscle firing, HEP, envelope of function, postural changes, POC    Person(s) Educated Patient    Methods Explanation;Demonstration;Tactile cues;Verbal cues;Handout    Comprehension Verbalized understanding;Returned demonstration;Verbal cues required;Tactile cues required              PT Short Term Goals - 04/18/21 1717       PT SHORT TERM  GOAL #1   Title Pt will become independent with HEP in order  to demonstrate synthesis of PT education.    Time 2    Period Weeks    Status New      PT SHORT TERM GOAL #2   Title Pt will be able to demonstrate 4+ /5 rectus abdominis MMT in order to demonstrate functional improvement in R LE function for return to PLOF.    Time 4    Period Weeks    Status New      PT SHORT TERM GOAL #3   Title Pt will be able to demonstrate full depth squat without trunk sway and deviation in order to demonstrate functional improvement in lumbopelvic strength and motor control.    Time 4    Period Weeks    Status New               PT Long Term Goals - 04/18/21 1717       PT LONG TERM GOAL #1   Title Pt will become independent with final HEP in order to demonstrate synthesis of PT education.    Time 8    Period Weeks    Status New      PT LONG TERM GOAL #2   Title Pt will be able to demonstrate ability to run/jog without pain in order to demonstrate functional improvement and tolerance to low level plyometric loading.    Time 8    Period Weeks    Status New      PT LONG TERM GOAL #3   Title Pt will demonstrate/report ability to stand/sit/sleep without pain in order to demonstrate functional improvement and tolerance to static positioning.    Time 8    Period Weeks    Status New                    Plan - 04/18/21 1722     Clinical Impression Statement Pt is a 25 y.o. 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 Pt presents with decreased lumbopelvic motor control, soft tissue restriction, and decreased funcitonal capacity for occupational and caregiver tasks. Pt s/s appear consistent with non-specific LBP. Pt symptoms are minimally irritable and sensitive. Pt's impairments limit participation with ADL, exercise, caregiver duties, and ocupation.  Pt would benefit from continued skilled therapy in order to reach goals and maximize functional lumbopelvic strength and ROM for full return to PLOF.    Personal  Factors and Comorbidities Time since onset of injury/illness/exacerbation;Comorbidity 1;Comorbidity 2;Fitness    Examination-Activity Limitations Locomotion Level;Carry;Lift;Squat;Stand;Caring for Others    Examination-Participation Restrictions Other;Occupation;Community Activity    Stability/Clinical Decision Making Stable/Uncomplicated    Clinical Decision Making Low    Rehab Potential Good    PT Frequency 1x / week    PT Duration 8 weeks    PT Treatment/Interventions ADLs/Self Care Home Management;Aquatic Therapy;Electrical Stimulation;Cryotherapy;Iontophoresis 4mg /ml Dexamethasone;Moist Heat;Traction;Ultrasound;Functional mobility training;Therapeutic activities;Therapeutic exercise;Balance training;Neuromuscular re-education;Patient/family education;Orthotic Fit/Training;Manual techniques;Passive range of motion;Scar mobilization;Dry needling;Joint Manipulations;Spinal Manipulations;Vasopneumatic Device;Taping    PT Next Visit Plan review HEP, child's pose, side plank, seated pelvic tilt, seated fwd flexion    PT Home Exercise Plan Access Code [email or web address]    Consulted and Agree with Plan of Care Patient             Patient will benefit from skilled therapeutic intervention in order to improve the following deficits and impairments:  Pain, Improper body mechanics, Decreased mobility, Increased muscle spasms, Postural  dysfunction, Impaired flexibility, Decreased range of motion, Decreased strength, Hypomobility, Decreased activity tolerance  Visit Diagnosis: Pain, lumbar region  Muscle weakness (generalized)     Problem List Patient Active Problem List   Diagnosis Date Noted   Low back pain 03/13/2021   Conjunctivitis 01/08/2021   Acute bacterial sinusitis 01/02/2021   Hypertension 01/02/2021   Major depressive disorder, single episode, moderate (HCC) 02/08/2016   Alcohol use    Lupus (HCC) 12/27/2014    Zebedee Iba PT, DPT 04/18/21 5:30 PM   [facility] [address] [place], [place], [address] Phone: [phone]   Fax:  [phone]  Name: Maclin Guerrette MRN: [id number] Date of Birth: 08-Nov-1995   PHYSICAL THERAPY DISCHARGE SUMMARY  Visits from Start of Care: 1   Plan: Patient goals were not able to be assessed. Patient is being discharged due to not returning to physical therapy. D/C episode of care.

## 2021-04-22 ENCOUNTER — Ambulatory Visit (HOSPITAL_BASED_OUTPATIENT_CLINIC_OR_DEPARTMENT_OTHER): Payer: 59 | Admitting: Physical Therapy

## 2021-04-24 ENCOUNTER — Other Ambulatory Visit (HOSPITAL_COMMUNITY): Payer: Self-pay

## 2021-04-24 MED FILL — Belimumab Subcutaneous Solution Auto-injector 200 MG/ML: SUBCUTANEOUS | 28 days supply | Qty: 4 | Fill #2 | Status: AC

## 2021-04-25 ENCOUNTER — Other Ambulatory Visit (HOSPITAL_COMMUNITY): Payer: Self-pay

## 2021-04-29 ENCOUNTER — Other Ambulatory Visit: Payer: Self-pay

## 2021-04-29 ENCOUNTER — Encounter (HOSPITAL_BASED_OUTPATIENT_CLINIC_OR_DEPARTMENT_OTHER): Payer: Self-pay | Admitting: Emergency Medicine

## 2021-04-29 ENCOUNTER — Emergency Department (HOSPITAL_BASED_OUTPATIENT_CLINIC_OR_DEPARTMENT_OTHER): Payer: 59

## 2021-04-29 ENCOUNTER — Emergency Department (HOSPITAL_BASED_OUTPATIENT_CLINIC_OR_DEPARTMENT_OTHER)
Admission: EM | Admit: 2021-04-29 | Discharge: 2021-04-29 | Disposition: A | Discharge status: Home / Self Care | Payer: 59 | Attending: Emergency Medicine | Admitting: Emergency Medicine

## 2021-04-29 ENCOUNTER — Ambulatory Visit (HOSPITAL_BASED_OUTPATIENT_CLINIC_OR_DEPARTMENT_OTHER): Payer: 59 | Admitting: Physical Therapy

## 2021-04-29 DIAGNOSIS — I10 Essential (primary) hypertension: Secondary | ICD-10-CM | POA: Diagnosis not present

## 2021-04-29 DIAGNOSIS — R079 Chest pain, unspecified: Secondary | ICD-10-CM | POA: Diagnosis not present

## 2021-04-29 DIAGNOSIS — R002 Palpitations: Secondary | ICD-10-CM | POA: Diagnosis not present

## 2021-04-29 DIAGNOSIS — Z79899 Other long term (current) drug therapy: Secondary | ICD-10-CM | POA: Diagnosis not present

## 2021-04-29 DIAGNOSIS — R202 Paresthesia of skin: Secondary | ICD-10-CM

## 2021-04-29 DIAGNOSIS — D696 Thrombocytopenia, unspecified: Secondary | ICD-10-CM | POA: Diagnosis not present

## 2021-04-29 DIAGNOSIS — E876 Hypokalemia: Secondary | ICD-10-CM | POA: Insufficient documentation

## 2021-04-29 DIAGNOSIS — R0789 Other chest pain: Secondary | ICD-10-CM | POA: Diagnosis not present

## 2021-04-29 LAB — BASIC METABOLIC PANEL
Anion gap: 11 (ref 5–15)
BUN: 15 mg/dL (ref 6–20)
CO2: 27 mmol/L (ref 22–32)
Calcium: 8.2 mg/dL — ABNORMAL LOW (ref 8.9–10.3)
Chloride: 104 mmol/L (ref 98–111)
Creatinine, Ser: 0.65 mg/dL (ref 0.61–1.24)
GFR, Estimated: >60 mL/min (ref >60)
Glucose, Bld: 93 mg/dL (ref 70–99)
Potassium: 3 mmol/L — ABNORMAL LOW (ref 3.5–5.1)
Sodium: 142 mmol/L (ref 135–145)

## 2021-04-29 LAB — CBC
HCT: 38 % — ABNORMAL LOW (ref 39.0–52.0)
Hemoglobin: 13.3 g/dL (ref 13.0–17.0)
MCH: 33.4 pg (ref 26.0–34.0)
MCHC: 35 g/dL (ref 30.0–36.0)
MCV: 95.5 fL (ref 80.0–100.0)
Platelets: 28 10*3/uL — CL (ref 150–400)
RBC: 3.98 MIL/uL — ABNORMAL LOW (ref 4.22–5.81)
RDW: 12.9 % (ref 11.5–15.5)
WBC: 2.5 10*3/uL — ABNORMAL LOW (ref 4.0–10.5)
nRBC: 0 % (ref 0.0–0.2)

## 2021-04-29 LAB — TROPONIN I (HIGH SENSITIVITY)
Troponin I (High Sensitivity): 6 ng/L (ref <18)
Troponin I (High Sensitivity): 5 ng/L (ref <18)

## 2021-04-29 LAB — CBG MONITORING, ED: Glucose-Capillary: 97 mg/dL (ref 70–99)

## 2021-04-29 MED ORDER — POTASSIUM CHLORIDE CRYS ER 20 MEQ PO TBCR
40.0000 meq | EXTENDED_RELEASE_TABLET | Freq: Once | ORAL | Status: AC
  Administered 2021-04-29: 40 meq via ORAL
  Filled 2021-04-29: qty 2

## 2021-04-29 NOTE — Discharge Instructions (Addendum)
Your work-up today did show some low potassium and slightly low calcium today.  We gave you potassium and encourage you to increase calcium rich foods.  Your other work-up did show low platelets which you have had in the past, please call your hematologist for further recommendations on this front.  Your other work-up was reassuring and your symptoms had all resolved by the time we evaluated you.  You remained stable without new symptoms for over 5 hours during observation on telemetry here.  No arrhythmias seen.  Given her otherwise well appearance, we had a shared decision-making conversation and feel you are safe for discharge home now.  Please follow-up with your hematology team and if any symptoms change or worsen acutely, return to the nearest emergency department.

## 2021-04-29 NOTE — ED Provider Notes (Signed)
[facility] Provider Note   CSN: [id number] Arrival date & time: 04/29/21  0600     History Chief Complaint  Patient presents with   Chest Pain    Chase Townsend is a 25 y.o. male.  The history is provided by the patient and medical records. No language interpreter was used.  Chest Pain Pain location:  Substernal area Pain quality: dull   Pain radiates to:  Does not radiate Pain severity:  Mild Onset quality:  Gradual Timing:  Intermittent Progression:  Resolved Chronicity:  Recurrent Relieved by: prednisone, tramadol, coreg, and tylenol. Worsened by:  Nothing Associated symptoms: palpitations   Associated symptoms: no abdominal pain, no altered mental status, no anxiety, no back pain, no cough, no diaphoresis, no dizziness, no fatigue, no fever, no headache, no lower extremity edema, no nausea, no shortness of breath, no vomiting and no weakness       Past Medical History:  Diagnosis Date   Allergy    Arthritis    Clotting disorder (HCC)    Depression    Lupus (HCC)     Patient Active Problem List   Diagnosis Date Noted   Low back pain 03/13/2021   Conjunctivitis 01/08/2021   Acute bacterial sinusitis 01/02/2021   Hypertension 01/02/2021   Major depressive disorder, single episode, moderate (HCC) 02/08/2016   Alcohol use    Lupus (HCC) 12/27/2014    Past Surgical History:  Procedure Laterality Date   COSMETIC SURGERY     LYMPH NODE BIOPSY     TONSILLECTOMY         Family History  Problem Relation Age of Onset   Stroke Maternal Grandmother    Cancer Maternal Grandmother    Diabetes Maternal Grandmother    Hyperlipidemia Maternal Grandfather     Social History   Tobacco Use   Smoking status: Never   Smokeless tobacco: Never  Vaping Use   Vaping Use: Never used  Substance Use Topics   Alcohol use: Yes    Alcohol/week: 4.0 standard drinks    Types: 4 Standard drinks or equivalent per week   Drug use: No     Home Medications Prior to Admission medications   Medication Sig Start Date End Date Taking? Authorizing Provider  acetaminophen (TYLENOL) 650 MG CR tablet Take 650 mg by mouth every 8 (eight) hours as needed for pain.    [provider]  Belimumab 200 MG/ML SOAJ INJECT 200 MG SUBCUTANEOUSLY EVERY 7 (SEVEN) DAYS 12/31/20 12/31/21  Quentin Angst, MD  carvedilol (COREG) 12.5 MG tablet Take 12.5 mg by mouth 2 (two) times daily. 12/19/20   [provider]  cetirizine (ZYRTEC) 10 MG tablet Take 1 tablet (10 mg total) by mouth at bedtime. Patient not taking: Reported on 04/18/2021 01/02/21   de [place], Buren Kos, MD  hydroxychloroquine (PLAQUENIL) 200 MG tablet Take 200 mg by mouth daily.     [provider]  ketotifen (ZADITOR) 0.025 % ophthalmic solution Place 1 drop into the left eye 2 (two) times daily. Patient not taking: Reported on 04/18/2021 01/08/21   de [place], Buren Kos, MD  loratadine (CLARITIN) 10 MG tablet Take 10 mg by mouth daily as needed for allergies.     [provider]  omega-3 fish oil (MAXEPA) 1000 MG CAPS capsule Take 1 capsule by mouth daily.    [provider]  predniSONE (DELTASONE) 5 MG tablet Take 5 mg by mouth daily with breakfast.     [provider]  traMADol (ULTRAM) 50 MG tablet Take 50 mg by mouth as needed. 01/29/18   [provider]    Allergies    Aspirin, Azathioprine, Ibuprofen, Singulair [montelukast sodium], and Amoxicillin-pot clavulanate  Review of Systems   Review of Systems  Constitutional:  Negative for chills, diaphoresis, fatigue and fever.  HENT:  Negative for congestion.   Respiratory:  Negative for cough, chest tightness, shortness of breath and wheezing.   Cardiovascular:  Positive for chest pain and palpitations. Negative for leg swelling.  Gastrointestinal:  Negative for abdominal pain, constipation, diarrhea, nausea and vomiting.  Genitourinary:  Negative for frequency.   Musculoskeletal:  Negative for back pain, neck pain and neck stiffness.  Skin:  Negative for rash and wound.  Neurological:  Negative for dizziness, weakness, light-headedness and headaches.  Psychiatric/Behavioral:  Negative for agitation and confusion.   All other systems reviewed and are negative.  Physical Exam Updated Vital Signs BP (!) 154/129   Pulse 70   Resp 18   Ht 5\' 6"  (1.676 m)   Wt 57.6 kg   SpO2 100%   BMI 20.50 kg/m   Physical Exam Vitals and nursing note reviewed.  Constitutional:      General: He is not in acute distress.    Appearance: He is well-developed. He is not ill-appearing, toxic-appearing or diaphoretic.  HENT:     Head: Normocephalic and atraumatic.  Eyes:     Conjunctiva/sclera: Conjunctivae normal.     Pupils: Pupils are equal, round, and reactive to light.  Cardiovascular:     Rate and Rhythm: Normal rate and regular rhythm.     Heart sounds: No murmur heard. Pulmonary:     Effort: Pulmonary effort is normal. No tachypnea or respiratory distress.     Breath sounds: Normal breath sounds. No decreased breath sounds, wheezing, rhonchi or rales.  Chest:     Chest wall: No tenderness.  Abdominal:     Palpations: Abdomen is soft.     Tenderness: There is no abdominal tenderness.  Musculoskeletal:     Cervical back: Neck supple.     Right lower leg: No tenderness. No edema.     Left lower leg: No tenderness. No edema.  Skin:    General: Skin is warm and dry.     Capillary Refill: Capillary refill takes less than 2 seconds.     Findings: No ecchymosis.  Neurological:     General: No focal deficit present.     Mental Status: He is alert.  Psychiatric:        Mood and Affect: Mood normal.    ED Results / Procedures / Treatments   Labs (all labs ordered are listed, but only abnormal results are displayed) Labs Reviewed  BASIC METABOLIC PANEL - Abnormal; Notable for the following components:      Result Value   Potassium 3.0 (*)     Calcium 8.2 (*)    All other components within normal limits  CBC - Abnormal; Notable for the following components:   WBC 2.5 (*)    RBC 3.98 (*)    HCT 38.0 (*)    Platelets 28 (*)    All other components within normal limits  CBG MONITORING, ED  TROPONIN I (HIGH SENSITIVITY)  TROPONIN I (HIGH SENSITIVITY)    EKG EKG Interpretation  Date/Time:  Monday April 29 2021 06:22:55 EDT Ventricular Rate:  87 PR Interval:  128 QRS Duration: 80 QT Interval:  374 QTC Calculation: 450 R Axis:   46 Text  Interpretation: Normal sinus rhythm Nonspecific T wave abnormality Confirmed by Nicanor Alcon, April ([id number]) on 04/29/2021 6:46:09 AM  Radiology DG Chest 2 View  Result Date: 04/29/2021 CLINICAL DATA:  25 year old male with chest pain. EXAM: CHEST - 2 VIEW COMPARISON:  None. FINDINGS: Cardiomediastinal silhouette is within normal limits. The lungs are well inflated. No focal consolidation. 1.5 cm nodular opacity right lung base, without follow-up indicating the patient of this age. No pneumothorax. No acute osseous abnormality. IMPRESSION: Normal chest. Electronically Signed   By: Roanna Banning MD   On: 04/29/2021 07:44    Procedures Procedures   Medications Ordered in ED Medications  potassium chloride SA (KLOR-CON) CR tablet 40 mEq (40 mEq Oral Given 04/29/21 6712)    ED Course  I have reviewed the triage vital signs and the nursing notes.  Pertinent labs & imaging results that were available during my care of the patient were reviewed by me and considered in my medical decision making (see chart for details).    MDM Rules/Calculators/A&P                           Chase Townsend is a 25 y.o. male with a past medical history significant for lupus, depression, hypertension, and chronic pains who presents with palpitations, hand cramping, and tingling, and chest pain.  Patient reports that earlier this morning he started having some sensation of cold fingers in both of his hands that was waxing  waning.  He reports he started feeling tingling but never were weak or numb.  He reports he started having some palpitations and feeling his heart rate was going fast and some chest discomfort.  He denies significant shortness of breath, fevers, or chills.  He reports he took his carvedilol, prednisone, tramadol, and Tylenol which he was instructed to take if he had the symptoms.  He reports he has the symptoms about once a month.  Normally they get better but they were not improving after the medications initially so he presented for evaluation.  He denies any neck pain, neck stiffness, or neck trauma.  Denies any other fevers, chills, cough, or COVID symptoms.  Denies leg pain or leg swelling.  Denies any other complaints.  On arrival, he reports the symptoms are completely resolved and he is now resting comfortably.  He just wants to make sure his labs look okay and there is nothing significantly abnormal.  On exam, lungs are clear and chest is nontender.  Abdomen is nontender.  He is in a sinus rhythm on EKG with no STEMI or significant arrhythmia.  Good pulses in extremities.  Normal grip strength, sensation, and no focal neurologic deficit on exam with symmetric smile, normal sensation and strength in the face, pupils symmetric and reactive normal extraocular movements, normal finger-nose-finger testing, and patient otherwise well-appearing.  He is on room air without difficulty.  His blood pressure is elevated but reports it is always in the 150s and his PCP knows about this.  He does report he has had continued rash from his lupus that is unchanged.  He suspects that his blood pressure got up leading to his symptoms and palpitations but now that it has improved and he is taking his medicines, he is feeling better.  He denies any new neck pain or neck stiffness so of low suspicion that he is having some cervical cause of his bilateral hand symptoms.  Patient had some work-up in triage initiated  including labs which  revealed he does have a low potassium of 3.0.  Will give oral potassium as this could have contributed some of his symptoms.  Calcium is also slightly low.  He has had low potassium and calcium in the past.  He agrees with getting a delta troponin given the chest discomfort he had earlier but he otherwise feeling well.  His CBC did return showing low platelets which he reports he has had chronically but we did discuss that it was down to 28.  He is not giving a good source of bleeding and he is completely asymptomatic now.  We discussed that we could do a more extensive work-up however as he is back to baseline he would rather call his hematology team and discuss further management and follow-up.  If he is feeling better after delta troponin and after oral p.o. challenged with potassium, anticipate discharge to follow-up with his hematology team.  11:17 AM Delta troponin was negative.  Patient is still feeling well.  He does not want further work-up at this time and will have him discharged to follow-up with his Hematology team.  Patient understands return precautions and plan of care and was discharged in good condition.  Final Clinical Impression(s) / ED Diagnoses Final diagnoses:  Palpitations  Chest pain, unspecified type  Tingling in extremities  Thrombocytopenia (HCC)  Hypokalemia    Rx / DC Orders ED Discharge Orders     None      Clinical Impression: 1. Palpitations   2. Chest pain, unspecified type   3. Tingling in extremities   4. Thrombocytopenia (HCC)   5. Hypokalemia     Disposition: Discharge  Condition: Good  I have discussed the results, Dx and Tx plan with the pt(& family if present). He/she/they expressed understanding and agree(s) with the plan. Discharge instructions discussed at great length. Strict return precautions discussed and pt &/or family have verbalized understanding of the instructions. No further questions at time of discharge.     New Prescriptions   No medications on file    Follow Up: de [place], Raymond J, MD [address] [place] [address] [phone]     [facility] [address] [id number] BT [facility] [place] [address] [phone]       Cordera Stineman, Canary Brim, MD 04/29/21 [phone]

## 2021-04-29 NOTE — ED Triage Notes (Signed)
C/o substernal chest pain, shob, and stiffness in BIL hands that started 4-5 hours ago. Also c/o dry mouth. EKG done at triage. CBG 97.

## 2021-05-04 ENCOUNTER — Other Ambulatory Visit (HOSPITAL_COMMUNITY): Payer: Self-pay

## 2021-05-06 ENCOUNTER — Encounter (HOSPITAL_BASED_OUTPATIENT_CLINIC_OR_DEPARTMENT_OTHER): Payer: Self-pay | Admitting: Family Medicine

## 2021-05-06 ENCOUNTER — Ambulatory Visit (INDEPENDENT_AMBULATORY_CARE_PROVIDER_SITE_OTHER): Payer: 59 | Admitting: Family Medicine

## 2021-05-06 ENCOUNTER — Other Ambulatory Visit: Payer: Self-pay

## 2021-05-06 ENCOUNTER — Other Ambulatory Visit (HOSPITAL_COMMUNITY): Payer: Self-pay

## 2021-05-06 VITALS — BP 142/110 | HR 75 | Ht 66.0 in | Wt 136.6 lb

## 2021-05-06 DIAGNOSIS — E876 Hypokalemia: Secondary | ICD-10-CM

## 2021-05-06 DIAGNOSIS — D696 Thrombocytopenia, unspecified: Secondary | ICD-10-CM | POA: Diagnosis not present

## 2021-05-06 DIAGNOSIS — R002 Palpitations: Secondary | ICD-10-CM | POA: Diagnosis not present

## 2021-05-06 DIAGNOSIS — I10 Essential (primary) hypertension: Secondary | ICD-10-CM | POA: Diagnosis not present

## 2021-05-06 MED ORDER — CARVEDILOL 12.5 MG PO TABS: 12.5000 mg | ORAL_TABLET | Freq: Two times a day (BID) | ORAL | 2 refills | Status: DC

## 2021-05-06 NOTE — Assessment & Plan Note (Signed)
Found to have low potassium during emergency department visit next time will repeat BMP today to assess current status Did have repletion when he was in the emergency department Given that patient had taken prednisone shortly before arrival in emergency department, this could have triggered his lab abnormality

## 2021-05-06 NOTE — Assessment & Plan Note (Signed)
Chronic, has been following with hematology although last documentation in our records was 4 years ago Patient reports that he saw hematology 1 year ago During recent visit to the emergency department, patient was found to have fairly low platelets even beyond his usual thrombocytopenia Recommend that he schedule follow-up with his hematologist If referral is required due to length of time since last appointment, he will let us know

## 2021-05-06 NOTE — Assessment & Plan Note (Addendum)
Does not check blood pressure at home regularly Continues taking carvedilol, last dose was yesterday, requesting refill today Blood pressure elevated in office today, tough to make sure assessment given that he is out of the medication Recommend monitoring at home, patient is requesting referral to alternative cardiologist, referral placed today Encouraged lifestyle modifications including DASH diet, regular physical activity

## 2021-05-06 NOTE — Progress Notes (Signed)
    Procedures performed today:    None.  Independent interpretation of notes and tests performed by another provider:   None.  Brief History, Exam, Impression, and Recommendations:    BP (!) 142/110   Pulse 75   Ht 5\' 6"  (1.676 m)   Wt 136 lb 9.6 oz (62 kg)   SpO2 99%   BMI 22.05 kg/m   Hypertension Does not check blood pressure at home regularly Continues taking carvedilol, last dose was yesterday, requesting refill today Blood pressure elevated in office today, tough to make sure assessment given that he is out of the medication Recommend monitoring at home, patient is requesting referral to alternative cardiologist, referral placed today Encouraged lifestyle modifications including DASH diet, regular physical activity  Thrombocytopenia (HCC) Chronic, has been following with hematology although last documentation in our records was 4 years ago Patient reports that he saw hematology 1 year ago During recent visit to the emergency department, patient was found to have fairly low platelets even beyond his usual thrombocytopenia Recommend that he schedule follow-up with his hematologist If referral is required due to length of time since last appointment, he will let know  Hypokalemia Found to have low potassium during emergency department visit next time will repeat BMP today to assess current status Did have repletion when he was in the emergency department Given that patient had taken prednisone shortly before arrival in emergency department, this could have triggered his lab abnormality  Spent 30 minutes on this patient encounter, including preparation, chart review, face-to-face counseling with patient and coordination of care, and documentation of encounter  Plan follow-up in about 4 months or sooner as needed   ___________________________________________ Ileanna Gemmill de [place], MD, ABFM, [facility] Primary Care and Sports Medicine [facility]

## 2021-05-06 NOTE — Patient Instructions (Signed)
  Medication Instructions:  Your physician recommends that you continue on your current medications as directed. Please refer to the Current Medication list given to you today. --If you need a refill on any your medications before your next appointment, please call your pharmacy first. If no refills are authorized on file call the office.--  Lab Work: Your physician has recommended that you have lab work today: BMET If you have labs (blood work) drawn today and your tests are completely normal, you will receive your results via MyChart message OR a phone call from our staff.  Please ensure you check your voicemail in the event that you authorized detailed messages to be left on a delegated number. If you have any lab test that is abnormal or we need to change your treatment, we will call you to review the results.  Referrals/Procedures/Imaging: A referral has been placed for you to Cardiology for evaluation and treatment. Someone from the scheduling department will be in contact with you in regards to coordinating your consultation. If you do not hear from any of the schedulers within 7-10 business days please give our office a call.  Follow-Up: Your next appointment:   Your physician recommends that you schedule a follow-up appointment in: 3-4 MONTHS with Dr. de Peru  Thanks for letting us be apart of your health journey!!  Primary Care and Sports Medicine   Dr. Ceasar Mons [place]   We encourage you to activate your patient portal called "MyChart".  Sign up information is provided on this After Visit Summary.  MyChart is used to connect with patients for Virtual Visits (Telemedicine).  Patients are able to view lab/test results, encounter notes, upcoming appointments, etc.  Non-urgent messages can be sent to your provider as well. To learn more about what you can do with MyChart, please visit --  [email or web address].

## 2021-05-07 LAB — BASIC METABOLIC PANEL
BUN/Creatinine Ratio: 11 (ref 9–20)
BUN: 8 mg/dL (ref 6–20)
CO2: 28 mmol/L (ref 20–29)
Calcium: 8.8 mg/dL (ref 8.7–10.2)
Chloride: 103 mmol/L (ref 96–106)
Creatinine, Ser: 0.75 mg/dL — ABNORMAL LOW (ref 0.76–1.27)
Glucose: 94 mg/dL (ref 65–99)
Potassium: 3 mmol/L — ABNORMAL LOW (ref 3.5–5.2)
Sodium: 144 mmol/L (ref 134–144)
eGFR: 127 mL/min/{1.73_m2} (ref >59)

## 2021-05-08 DIAGNOSIS — M329 Systemic lupus erythematosus, unspecified: Secondary | ICD-10-CM | POA: Diagnosis not present

## 2021-05-08 DIAGNOSIS — R682 Dry mouth, unspecified: Secondary | ICD-10-CM | POA: Diagnosis not present

## 2021-05-08 DIAGNOSIS — Z79899 Other long term (current) drug therapy: Secondary | ICD-10-CM | POA: Diagnosis not present

## 2021-05-08 DIAGNOSIS — Z7952 Long term (current) use of systemic steroids: Secondary | ICD-10-CM | POA: Diagnosis not present

## 2021-05-08 DIAGNOSIS — I10 Essential (primary) hypertension: Secondary | ICD-10-CM | POA: Diagnosis not present

## 2021-05-08 DIAGNOSIS — R0602 Shortness of breath: Secondary | ICD-10-CM | POA: Diagnosis not present

## 2021-05-08 DIAGNOSIS — L930 Discoid lupus erythematosus: Secondary | ICD-10-CM | POA: Diagnosis not present

## 2021-05-16 ENCOUNTER — Telehealth (HOSPITAL_BASED_OUTPATIENT_CLINIC_OR_DEPARTMENT_OTHER): Payer: Self-pay

## 2021-05-16 ENCOUNTER — Other Ambulatory Visit (HOSPITAL_COMMUNITY): Payer: Self-pay

## 2021-05-16 NOTE — Telephone Encounter (Signed)
-----   Message from Hosie Poisson [place], MD sent at 05/07/2021 10:29 AM EDT ----- Calcium level has improved and is now within normal limits.  Potassium is still low at 3.0.  Suspect this is likely related to ongoing prednisone use.  Thus, would recommend incorporating foods high in potassium into the diet including potatoes, beans, leafy greens, tomatoes, bananas.

## 2021-05-16 NOTE — Telephone Encounter (Signed)
Attempted to call patient to discuss lab results Patient has a voicemail box that is full and cannot receive messages Will attempt to contact at a later date

## 2021-05-17 ENCOUNTER — Emergency Department (HOSPITAL_BASED_OUTPATIENT_CLINIC_OR_DEPARTMENT_OTHER)
Admission: EM | Admit: 2021-05-17 | Discharge: 2021-05-17 | Disposition: A | Discharge status: Home / Self Care | Payer: 59 | Attending: Emergency Medicine | Admitting: Emergency Medicine

## 2021-05-17 ENCOUNTER — Encounter (HOSPITAL_BASED_OUTPATIENT_CLINIC_OR_DEPARTMENT_OTHER): Payer: Self-pay | Admitting: Emergency Medicine

## 2021-05-17 DIAGNOSIS — R55 Syncope and collapse: Secondary | ICD-10-CM | POA: Insufficient documentation

## 2021-05-17 DIAGNOSIS — R42 Dizziness and giddiness: Secondary | ICD-10-CM | POA: Insufficient documentation

## 2021-05-17 DIAGNOSIS — E876 Hypokalemia: Secondary | ICD-10-CM | POA: Insufficient documentation

## 2021-05-17 DIAGNOSIS — Z79899 Other long term (current) drug therapy: Secondary | ICD-10-CM | POA: Diagnosis not present

## 2021-05-17 DIAGNOSIS — I10 Essential (primary) hypertension: Secondary | ICD-10-CM | POA: Insufficient documentation

## 2021-05-17 HISTORY — DX: Essential (primary) hypertension: I10

## 2021-05-17 LAB — CBC WITH DIFFERENTIAL/PLATELET
Abs Immature Granulocytes: 0.01 10*3/uL (ref 0.00–0.07)
Basophils Absolute: 0 10*3/uL (ref 0.0–0.1)
Basophils Relative: 0 %
Eosinophils Absolute: 0 10*3/uL (ref 0.0–0.5)
Eosinophils Relative: 0 %
HCT: 42 % (ref 39.0–52.0)
Hemoglobin: 14.8 g/dL (ref 13.0–17.0)
Immature Granulocytes: 0 %
Lymphocytes Relative: 48 %
Lymphs Abs: 1.2 10*3/uL (ref 0.7–4.0)
MCH: 33 pg (ref 26.0–34.0)
MCHC: 35.2 g/dL (ref 30.0–36.0)
MCV: 93.8 fL (ref 80.0–100.0)
Monocytes Absolute: 0.3 10*3/uL (ref 0.1–1.0)
Monocytes Relative: 14 %
Neutro Abs: 0.9 10*3/uL — ABNORMAL LOW (ref 1.7–7.7)
Neutrophils Relative %: 38 %
Platelets: 59 10*3/uL — ABNORMAL LOW (ref 150–400)
RBC: 4.48 MIL/uL (ref 4.22–5.81)
RDW: 13.3 % (ref 11.5–15.5)
WBC: 2.4 10*3/uL — ABNORMAL LOW (ref 4.0–10.5)
nRBC: 0 % (ref 0.0–0.2)

## 2021-05-17 LAB — COMPREHENSIVE METABOLIC PANEL
ALT: 138 U/L — ABNORMAL HIGH (ref 0–44)
AST: 362 U/L — ABNORMAL HIGH (ref 15–41)
Albumin: 3.8 g/dL (ref 3.5–5.0)
Alkaline Phosphatase: 125 U/L (ref 38–126)
Anion gap: 16 — ABNORMAL HIGH (ref 5–15)
BUN: 15 mg/dL (ref 6–20)
CO2: 24 mmol/L (ref 22–32)
Calcium: 8.4 mg/dL — ABNORMAL LOW (ref 8.9–10.3)
Chloride: 106 mmol/L (ref 98–111)
Creatinine, Ser: 0.74 mg/dL (ref 0.61–1.24)
GFR, Estimated: >60 mL/min (ref >60)
Glucose, Bld: 80 mg/dL (ref 70–99)
Potassium: 3 mmol/L — ABNORMAL LOW (ref 3.5–5.1)
Sodium: 146 mmol/L — ABNORMAL HIGH (ref 135–145)
Total Bilirubin: 1.4 mg/dL — ABNORMAL HIGH (ref 0.3–1.2)
Total Protein: 6.7 g/dL (ref 6.5–8.1)

## 2021-05-17 MED ORDER — POTASSIUM CHLORIDE CRYS ER 20 MEQ PO TBCR
40.0000 meq | EXTENDED_RELEASE_TABLET | Freq: Once | ORAL | Status: AC
  Administered 2021-05-17: 40 meq via ORAL
  Filled 2021-05-17: qty 2

## 2021-05-17 MED ORDER — SODIUM CHLORIDE 0.9 % IV BOLUS
1000.0000 mL | Freq: Once | INTRAVENOUS | Status: AC
  Administered 2021-05-17: 1000 mL via INTRAVENOUS

## 2021-05-17 MED ORDER — POTASSIUM CHLORIDE CRYS ER 20 MEQ PO TBCR: 20.0000 meq | EXTENDED_RELEASE_TABLET | Freq: Two times a day (BID) | ORAL | 0 refills | Status: DC

## 2021-05-17 MED ORDER — SODIUM CHLORIDE 0.9 % IV SOLN: INTRAVENOUS | Status: DC

## 2021-05-17 NOTE — ED Triage Notes (Signed)
Patient at work when witnessed syncopal episode. Coworker states saw patients eyes roll into back of head. Patient alert and able to help move into bed. States he recently saw him rheumatologist and given potassium due to low counts. Patient states feeling weak and not well over the past few days.

## 2021-05-17 NOTE — ED Notes (Signed)
Dr approved for pt to eat and drink.

## 2021-05-17 NOTE — ED Provider Notes (Signed)
[facility] EMERGENCY DEPT Provider Note   CSN: [id number] Arrival date & time: 05/17/21  0750     History Chief Complaint  Patient presents with   Loss of Consciousness    Chase Townsend is a 25 y.o. male.  Patient works here as a Electrical engineer.  And a witnessed syncopal episode.  Patient stated that he was not feeling well this morning started to feel extremely lightheaded eyes rolled back in his head when he went down no injuries with the fall.  Patient has a history of lupus.  Recently seen by his rheumatologist given potassium and he is on steroids for lupus flare.  Patient been feeling dry in the mouth and been feeling weak for the past few days.  Patient also has a history of thrombocytopenia.      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/08/2016   Alcohol use    Lupus (HCC) 12/27/2014    Past Surgical History:  Procedure Laterality Date   COSMETIC SURGERY     LYMPH NODE BIOPSY     TONSILLECTOMY         Family History  Problem Relation Age of Onset   Stroke Maternal Grandmother    Cancer Maternal Grandmother    Diabetes Maternal Grandmother    Hyperlipidemia Maternal Grandfather     Social History   Tobacco Use   Smoking status: Never   Smokeless tobacco: Never  Vaping Use   Vaping Use: Never used  Substance Use Topics   Alcohol use: Yes    Alcohol/week: 4.0 standard drinks    Types: 4 Standard drinks or equivalent per week   Drug use: No    Home Medications Prior to Admission medications   Medication Sig Start Date End Date Taking?  Authorizing Provider  acetaminophen (TYLENOL) 650 MG CR tablet Take 650 mg by mouth every 8 (eight) hours as needed for pain.    [provider]  Belimumab 200 MG/ML SOAJ INJECT 200 MG SUBCUTANEOUSLY EVERY 7 (SEVEN) DAYS 12/31/20 12/31/21  Quentin Angst, MD  carvedilol (COREG) 12.5 MG tablet Take 1 tablet (12.5 mg total) by mouth 2 (two) times daily. 05/06/21   de [place], Buren Kos, MD  hydroxychloroquine (PLAQUENIL) 200 MG tablet Take 200 mg by mouth daily.     [provider]  loratadine (CLARITIN) 10 MG tablet Take 10 mg by mouth daily as needed for allergies.     [provider]  omega-3 fish oil (MAXEPA) 1000 MG CAPS capsule Take 1 capsule by mouth daily.    [provider]  predniSONE (DELTASONE) 5 MG tablet Take 5 mg by mouth daily with breakfast.     [provider]  traMADol (ULTRAM) 50 MG tablet Take 50 mg by mouth as needed. 01/29/18   [provider]    Allergies    Aspirin, Azathioprine, Ibuprofen, Singulair [montelukast sodium], and Amoxicillin-pot clavulanate  Review of Systems   Review of Systems  Constitutional:  Positive for fatigue. Negative for chills and fever.  HENT:  Negative for ear pain  and sore throat.   Eyes:  Negative for pain and visual disturbance.  Respiratory:  Negative for cough and shortness of breath.   Cardiovascular:  Negative for chest pain and palpitations.  Gastrointestinal:  Negative for abdominal pain and vomiting.  Genitourinary:  Negative for dysuria and hematuria.  Musculoskeletal:  Negative for arthralgias and back pain.  Skin:  Negative for color change and rash.  Neurological:  Positive for syncope. Negative for seizures.  All other systems reviewed and are negative.  Physical Exam Updated Vital Signs BP (!) 170/125   Pulse 84   Temp 98.2 F (36.8 C) (Oral)   Resp 20   Ht 1.676 m (5\' 6" )   Wt 62.1 kg   SpO2 100%   BMI 22.11 kg/m   Physical Exam Vitals and nursing note  reviewed.  Constitutional:      General: He is in acute distress.     Appearance: Normal appearance. He is well-developed.  HENT:     Head: Normocephalic and atraumatic.  Eyes:     Extraocular Movements: Extraocular movements intact.     Conjunctiva/sclera: Conjunctivae normal.     Pupils: Pupils are equal, round, and reactive to light.  Cardiovascular:     Rate and Rhythm: Normal rate and regular rhythm.     Heart sounds: No murmur heard. Pulmonary:     Effort: Pulmonary effort is normal. No respiratory distress.     Breath sounds: Normal breath sounds. No wheezing or rales.  Abdominal:     Palpations: Abdomen is soft.     Tenderness: There is no abdominal tenderness.  Musculoskeletal:        General: No signs of injury. Normal range of motion.     Cervical back: Normal range of motion and neck supple. No rigidity.  Skin:    General: Skin is warm and dry.  Neurological:     General: No focal deficit present.     Mental Status: He is alert and oriented to person, place, and time.     Cranial Nerves: No cranial nerve deficit.     Sensory: No sensory deficit.    ED Results / Procedures / Treatments   Labs (all labs ordered are listed, but only abnormal results are displayed) Labs Reviewed  COMPREHENSIVE METABOLIC PANEL - Abnormal; Notable for the following components:      Result Value   Sodium 146 (*)    Potassium 3.0 (*)    Calcium 8.4 (*)    AST 362 (*)    ALT 138 (*)    Total Bilirubin 1.4 (*)    Anion gap 16 (*)    All other components within normal limits  CBC WITH DIFFERENTIAL/PLATELET - Abnormal; Notable for the following components:   WBC 2.4 (*)    Platelets 59 (*)    Neutro Abs 0.9 (*)    All other components within normal limits    EKG EKG Interpretation  Date/Time:  Friday May 17 2021 07:59:50 EDT Ventricular Rate:  92 PR Interval:  157 QRS Duration: 113 QT Interval:  353 QTC Calculation: 437 R Axis:   15 Text Interpretation: Sinus rhythm  Borderline intraventricular conduction delay RSR' in V1 or V2, right VCD or RVH Nonspecific T abnormalities, anterior leads Confirmed by 09[birth year]18[birth year]1984 [phone]) on 05/17/2021 9:36:23 AM  Radiology No results found.  Procedures Procedures   Medications Ordered in ED Medications  0.9 %  sodium chloride infusion ( Intravenous New Bag/Given 05/17/21 0851)  potassium chloride SA (KLOR-CON) CR  tablet 40 mEq (has no administration in time range)  sodium chloride 0.9 % bolus 1,000 mL (0 mLs Intravenous Stopped 05/17/21 0850)    ED Course  I have reviewed the triage vital signs and the nursing notes.  Pertinent labs & imaging results that were available during my care of the patient were reviewed by me and considered in my medical decision making (see chart for details).    MDM Rules/Calculators/A&P                          CRITICAL CARE Performed by: Vanetta Mulders Total critical care time: 40 minutes Critical care time was exclusive of separately billable procedures and treating other patients. Critical care was necessary to treat or prevent imminent or life-threatening deterioration. Critical care was time spent personally by me on the following activities: development of treatment plan with patient and/or surrogate as well as nursing, discussions with consultants, evaluation of patient's response to treatment, examination of patient, obtaining history from patient or surrogate, ordering and performing treatments and interventions, ordering and review of laboratory studies, ordering and review of radiographic studies, pulse oximetry and re-evaluation of patient's condition.   Patient with syncopal episode.  Most likely vasovagal.  Cardiac monitoring here without arrhythmia.  Patient does have some hypokalemia.  Potassium was 3 but renal function was very normal so no evidence of any significant dehydration patient has lupus and is on steroids.  Despite that does not have a significant  leukocytosis.  Has some liver function test abnormalities but has no abdominal tenderness.  Feel this probably secondary to his lupus.  No significant anemia.  Platelet counts here today are 59,000 which is actually good for the patient.  Patient given some oral potassium here will be discharged home with potassium.  Work note provided.  Patient will follow up with his doctors.  Patient will return for any new or worse symptoms.  Final Clinical Impression(s) / ED Diagnoses Final diagnoses:  Vasovagal syncope  Hypokalemia    Rx / DC Orders ED Discharge Orders     None        Vanetta Mulders, MD 05/17/21 1149

## 2021-05-17 NOTE — Discharge Instructions (Addendum)
Take the potassium as directed.  Return for any new or worse symptoms.  Work note provided.

## 2021-05-17 NOTE — ED Notes (Signed)
Patient verbalizes understanding of discharge instructions. Opportunity for questioning and answers were provided. Patient discharged from ED.  °

## 2021-05-23 DIAGNOSIS — Z79899 Other long term (current) drug therapy: Secondary | ICD-10-CM | POA: Diagnosis not present

## 2021-05-29 ENCOUNTER — Other Ambulatory Visit (HOSPITAL_COMMUNITY): Payer: Self-pay

## 2021-06-03 ENCOUNTER — Other Ambulatory Visit (HOSPITAL_COMMUNITY): Payer: Self-pay

## 2021-06-03 DIAGNOSIS — Z20822 Contact with and (suspected) exposure to covid-19: Secondary | ICD-10-CM | POA: Diagnosis not present

## 2021-06-07 ENCOUNTER — Other Ambulatory Visit (HOSPITAL_COMMUNITY): Payer: Self-pay

## 2021-06-07 MED FILL — Belimumab Subcutaneous Solution Auto-injector 200 MG/ML: SUBCUTANEOUS | 28 days supply | Qty: 4 | Fill #3 | Status: AC

## 2021-06-11 ENCOUNTER — Other Ambulatory Visit (HOSPITAL_COMMUNITY): Payer: Self-pay

## 2021-06-12 ENCOUNTER — Other Ambulatory Visit (HOSPITAL_COMMUNITY): Payer: Self-pay

## 2021-06-18 ENCOUNTER — Ambulatory Visit (INDEPENDENT_AMBULATORY_CARE_PROVIDER_SITE_OTHER): Payer: 59 | Admitting: Family Medicine

## 2021-06-18 ENCOUNTER — Other Ambulatory Visit (HOSPITAL_BASED_OUTPATIENT_CLINIC_OR_DEPARTMENT_OTHER): Payer: Self-pay

## 2021-06-18 ENCOUNTER — Other Ambulatory Visit: Payer: Self-pay

## 2021-06-18 DIAGNOSIS — M329 Systemic lupus erythematosus, unspecified: Secondary | ICD-10-CM

## 2021-06-18 NOTE — Progress Notes (Signed)
Received faxed lab orders from [facility] for patient to have labs collected at our office Orders placed  Results will be CC to ordering physician.  Dr Rexene Agent (P) [phone]

## 2021-06-18 NOTE — Progress Notes (Signed)
Patient reports for labs  Orders placed

## 2021-06-21 LAB — CBC WITH DIFFERENTIAL/PLATELET
Basophils Absolute: 0 10*3/uL (ref 0.0–0.2)
Basos: 0 %
EOS (ABSOLUTE): 0 10*3/uL (ref 0.0–0.4)
Eos: 0 %
Hematocrit: 37.2 % — ABNORMAL LOW (ref 37.5–51.0)
Hemoglobin: 13.2 g/dL (ref 13.0–17.7)
Immature Grans (Abs): 0 10*3/uL (ref 0.0–0.1)
Immature Granulocytes: 1 %
Lymphocytes Absolute: 1 10*3/uL (ref 0.7–3.1)
Lymphs: 55 %
MCH: 33.3 pg — ABNORMAL HIGH (ref 26.6–33.0)
MCHC: 35.5 g/dL (ref 31.5–35.7)
MCV: 94 fL (ref 79–97)
Monocytes Absolute: 0.3 10*3/uL (ref 0.1–0.9)
Monocytes: 15 %
Neutrophils Absolute: 0.5 10*3/uL — ABNORMAL LOW (ref 1.4–7.0)
Neutrophils: 29 %
Platelets: 39 10*3/uL — CL (ref 150–450)
RBC: 3.96 x10E6/uL — ABNORMAL LOW (ref 4.14–5.80)
RDW: 12.1 % (ref 11.6–15.4)
WBC: 1.8 10*3/uL — CL (ref 3.4–10.8)

## 2021-06-21 LAB — QUANTIFERON-TB GOLD PLUS
QuantiFERON Mitogen Value: 3.71 IU/mL
QuantiFERON Nil Value: 0.05 IU/mL
QuantiFERON TB1 Ag Value: 0.05 IU/mL
QuantiFERON TB2 Ag Value: 0.05 IU/mL
QuantiFERON-TB Gold Plus: NEGATIVE

## 2021-06-26 ENCOUNTER — Other Ambulatory Visit (HOSPITAL_BASED_OUTPATIENT_CLINIC_OR_DEPARTMENT_OTHER): Payer: Self-pay | Admitting: Family Medicine

## 2021-07-02 ENCOUNTER — Other Ambulatory Visit: Payer: Self-pay

## 2021-07-02 ENCOUNTER — Ambulatory Visit (HOSPITAL_BASED_OUTPATIENT_CLINIC_OR_DEPARTMENT_OTHER): Payer: 59 | Admitting: Family Medicine

## 2021-07-02 ENCOUNTER — Other Ambulatory Visit (HOSPITAL_COMMUNITY): Payer: Self-pay

## 2021-07-02 ENCOUNTER — Other Ambulatory Visit (HOSPITAL_BASED_OUTPATIENT_CLINIC_OR_DEPARTMENT_OTHER): Payer: Self-pay

## 2021-07-02 ENCOUNTER — Encounter (HOSPITAL_BASED_OUTPATIENT_CLINIC_OR_DEPARTMENT_OTHER): Payer: Self-pay | Admitting: Family Medicine

## 2021-07-02 VITALS — BP 172/116 | HR 76 | Ht 66.0 in | Wt 133.8 lb

## 2021-07-02 DIAGNOSIS — L089 Local infection of the skin and subcutaneous tissue, unspecified: Secondary | ICD-10-CM | POA: Insufficient documentation

## 2021-07-02 MED ORDER — SULFAMETHOXAZOLE-TRIMETHOPRIM 800-160 MG PO TABS
1.0000 | ORAL_TABLET | Freq: Two times a day (BID) | ORAL | 0 refills | Status: DC
  Filled 2021-07-02: qty 14, 7d supply, fill #0

## 2021-07-02 MED ORDER — COVID-19 AT HOME ANTIGEN TEST VI KIT
PACK | 0 refills | Status: DC
  Filled 2021-07-02: qty 2, 4d supply, fill #0

## 2021-07-02 MED ORDER — SULFAMETHOXAZOLE-TRIMETHOPRIM 800-160 MG PO TABS: 1.0000 | ORAL_TABLET | Freq: Two times a day (BID) | ORAL | 0 refills | Status: DC

## 2021-07-02 NOTE — Progress Notes (Signed)
    Procedures performed today:    None.  Independent interpretation of notes and tests performed by another provider:   None.  Brief History, Exam, Impression, and Recommendations:    BP (!) 172/116   Pulse 76   Ht 5\' 6"  (1.676 m)   Wt 133 lb 12.8 oz (60.7 kg)   SpO2 100%   BMI 21.60 kg/m   Skin infection Reports that about 2 days ago he noticed a bump in his suprapubic area that was tender and popped that same day.  Had some drainage that was bloody may be had some purulent material.  He has continued to have drainage from the site with surrounding skin firmness and tenderness.  His concern is related to possible ingrown hair, infection, abscess.  Reports that he has had some ingrown hairs in the past, however they typically have not led to as much swelling and tenderness as he is having now.  He denies any systemic symptoms such as fever, chills, night sweats. On exam, patient has very small opening the skin with slight bloody drainage present.  There is surrounding induration, erythema noted.  There is tenderness to palpation in this area with primarily bloody drainage expressed from present opening. Discussed nature of soft tissue infection.  Recommend initiating antibiotics given surrounding erythema.  Suspect that there is a small abscess present which will require drainage Plan to have patient return to the office in about 3 days to reassess after initiation of antibiotics.  Likely will require extending skin opening with incision and draining any purulent material/abscess at that time Plan for follow-up in 3 days or sooner as needed if patient has worsening of symptoms   ___________________________________________ Arianny Pun de , MD, ABFM, CAQSM Primary Care and Sports Medicine [facility]

## 2021-07-02 NOTE — Patient Instructions (Signed)
  Medication Instructions:  Your physician has recommended you make the following change in your medication:  -- START Bactrim - Take 1 tablet by mouth twice daily for 7 days --If you need a refill on any your medications before your next appointment, please call your pharmacy first. If no refills are authorized on file call the office.--  Follow-Up: Your next appointment:   Your physician recommends that you schedule a follow-up appointment on Friday 07/05/21 at 9:00 am with Dr. de [place]  You will receive a text message or e-mail with a link to a survey about your care and experience with [place] today! We would greatly appreciate your feedback!   Thanks for letting us be apart of your health journey!!  Primary Care and Sports Medicine   Dr. Ceasar Mons [place]   We encourage you to activate your patient portal called "MyChart".  Sign up information is provided on this After Visit Summary.  MyChart is used to connect with patients for Virtual Visits (Telemedicine).  Patients are able to view lab/test results, encounter notes, upcoming appointments, etc.  Non-urgent messages can be sent to your provider as well. To learn more about what you can do with MyChart, please visit --  [email or web address].

## 2021-07-03 NOTE — Assessment & Plan Note (Signed)
Reports that about 2 days ago he noticed a bump in his suprapubic area that was tender and popped that same day.  Had some drainage that was bloody may be had some purulent material.  He has continued to have drainage from the site with surrounding skin firmness and tenderness.  His concern is related to possible ingrown hair, infection, abscess.  Reports that he has had some ingrown hairs in the past, however they typically have not led to as much swelling and tenderness as he is having now.  He denies any systemic symptoms such as fever, chills, night sweats. On exam, patient has very small opening the skin with slight bloody drainage present.  There is surrounding induration, erythema noted.  There is tenderness to palpation in this area with primarily bloody drainage expressed from present opening. Discussed nature of soft tissue infection.  Recommend initiating antibiotics given surrounding erythema.  Suspect that there is a small abscess present which will require drainage Plan to have patient return to the office in about 3 days to reassess after initiation of antibiotics.  Likely will require extending skin opening with incision and draining any purulent material/abscess at that time Plan for follow-up in 3 days or sooner as needed if patient has worsening of symptoms

## 2021-07-05 ENCOUNTER — Other Ambulatory Visit: Payer: Self-pay

## 2021-07-05 ENCOUNTER — Encounter (HOSPITAL_BASED_OUTPATIENT_CLINIC_OR_DEPARTMENT_OTHER): Payer: Self-pay | Admitting: Family Medicine

## 2021-07-05 ENCOUNTER — Ambulatory Visit (HOSPITAL_BASED_OUTPATIENT_CLINIC_OR_DEPARTMENT_OTHER): Payer: 59 | Admitting: Family Medicine

## 2021-07-05 VITALS — BP 137/110 | HR 81 | Ht 66.0 in | Wt 133.0 lb

## 2021-07-05 DIAGNOSIS — L089 Local infection of the skin and subcutaneous tissue, unspecified: Secondary | ICD-10-CM | POA: Diagnosis not present

## 2021-07-05 NOTE — Patient Instructions (Addendum)
  Medication Instructions:  Your physician recommends that you continue on your current medications as directed. Please refer to the Current Medication list given to you today. --If you need a refill on any your medications before your next appointment, please call your pharmacy first. If no refills are authorized on file call the office.-- Follow-Up: Your next appointment:   Your physician recommends that you schedule a follow-up appointment in: Next Wednesday (10/05) with Dr. de [place]  You will receive a text message or e-mail with a link to a survey about your care and experience with [place] today! We would greatly appreciate your feedback!   Thanks for letting us be apart of your health journey!!  Primary Care and Sports Medicine   Dr. Ceasar Mons [place]   We encourage you to activate your patient portal called "MyChart".  Sign up information is provided on this After Visit Summary.  MyChart is used to connect with patients for Virtual Visits (Telemedicine).  Patients are able to view lab/test results, encounter notes, upcoming appointments, etc.  Non-urgent messages can be sent to your provider as well. To learn more about what you can do with MyChart, please visit --  [email or web address].

## 2021-07-05 NOTE — Assessment & Plan Note (Signed)
Has been taking Bactrim as prescribed Denies any side effects from the medication, no significant GI upset Continues to have some firmness and discomfort in the area at waistline Has had some mild discharge, decreased some since last visit On exam, still with small break in skin, continues to have some induration, firmness of soft tissues, mild surrounding erythema Discussed recommendations with patient Incision and drainage completed today, procedure note above Recommend continuing with prescribed course of antibiotics until completion Continue to monitor for any systemic symptoms Plan for follow-up in about 5 days to monitor progress or sooner as needed

## 2021-07-05 NOTE — Progress Notes (Signed)
    Procedures performed today:    Abscess drainage Performed by: Jacquie Lukes de [place] Verbal informed consent obtained, risks and benefits discussed Timeout taken to confirm patient, procedure, equipment, site/side Location details: Lower abdomen, near waistline Anesthesia: Local infiltration with lidocaine 1% with epinephrine Anesthetic total: 2 mL Description: Patient presented with abscess on lower abdomen.  Small amount of purulent material was expressed.  Abscess was explored using a cotton Q-tip. Cavity with depth of about 1.5 cm.  Wound was irrigated with sterile normal saline.  Wound was covered with 4 x 4 gauze and paper tape to protect the area and allow for continued drainage.  Patient was instructed on proper care and precautions.  Blood loss minimal.  Patient tolerated procedure extremely well with no immediate complications.  Independent interpretation of notes and tests performed by another provider:   None.  Brief History, Exam, Impression, and Recommendations:    BP (!) 137/110   Pulse 81   Ht 5\' 6"  (1.676 m)   Wt 133 lb (60.3 kg)   SpO2 98%   BMI 21.47 kg/m   Skin infection Has been taking Bactrim as prescribed Denies any side effects from the medication, no significant GI upset Continues to have some firmness and discomfort in the area at waistline Has had some mild discharge, decreased some since last visit On exam, still with small break in skin, continues to have some induration, firmness of soft tissues, mild surrounding erythema Discussed recommendations with patient Incision and drainage completed today, procedure note above Recommend continuing with prescribed course of antibiotics until completion Continue to monitor for any systemic symptoms Plan for follow-up in about 5 days to monitor progress or sooner as needed   ___________________________________________ Quadarius Henton de , MD, ABFM, CAQSM Primary Care and Sports Medicine [facility]

## 2021-07-10 ENCOUNTER — Ambulatory Visit (HOSPITAL_BASED_OUTPATIENT_CLINIC_OR_DEPARTMENT_OTHER): Payer: 59 | Admitting: Family Medicine

## 2021-07-22 ENCOUNTER — Other Ambulatory Visit (HOSPITAL_COMMUNITY): Payer: Self-pay

## 2021-07-22 MED FILL — Belimumab Subcutaneous Solution Auto-injector 200 MG/ML: SUBCUTANEOUS | 28 days supply | Qty: 4 | Fill #4 | Status: AC

## 2021-07-23 ENCOUNTER — Other Ambulatory Visit (HOSPITAL_COMMUNITY): Payer: Self-pay

## 2021-08-06 ENCOUNTER — Other Ambulatory Visit (HOSPITAL_BASED_OUTPATIENT_CLINIC_OR_DEPARTMENT_OTHER): Payer: Self-pay

## 2021-08-06 DIAGNOSIS — M329 Systemic lupus erythematosus, unspecified: Secondary | ICD-10-CM

## 2021-08-06 NOTE — Progress Notes (Signed)
Faxed orders received from Dr. Rexene Agent at [facility] All results should be sent to her

## 2021-08-09 ENCOUNTER — Ambulatory Visit (INDEPENDENT_AMBULATORY_CARE_PROVIDER_SITE_OTHER): Payer: 59 | Admitting: Family Medicine

## 2021-08-09 ENCOUNTER — Other Ambulatory Visit: Payer: Self-pay

## 2021-08-09 DIAGNOSIS — M329 Systemic lupus erythematosus, unspecified: Secondary | ICD-10-CM

## 2021-08-09 NOTE — Progress Notes (Signed)
   Subjective:    Patient ID: Chase Townsend, male    DOB: 1995/11/06, 25 y.o.   MRN: [id number]  HPI    Review of Systems     Objective:   Physical Exam        Assessment & Plan:   Patient presents for [place] for labs ordered by Rheumatologist Orders placed and req given to Phlebotomist Patient blood drawn by [facility]

## 2021-08-11 LAB — HEPATIC FUNCTION PANEL
ALT: 130 IU/L — ABNORMAL HIGH (ref 0–44)
AST: 440 IU/L — ABNORMAL HIGH (ref 0–40)
Albumin: 3.6 g/dL — ABNORMAL LOW (ref 4.1–5.2)
Alkaline Phosphatase: 245 IU/L — ABNORMAL HIGH (ref 44–121)
Bilirubin Total: 1.6 mg/dL — ABNORMAL HIGH (ref 0.0–1.2)
Bilirubin, Direct: 0.98 mg/dL — ABNORMAL HIGH (ref 0.00–0.40)
Total Protein: 5.9 g/dL — ABNORMAL LOW (ref 6.0–8.5)

## 2021-08-11 LAB — CBC WITH DIFFERENTIAL/PLATELET
Basophils Absolute: 0 10*3/uL (ref 0.0–0.2)
Basos: 1 %
EOS (ABSOLUTE): 0 10*3/uL (ref 0.0–0.4)
Eos: 0 %
Hematocrit: 36 % — ABNORMAL LOW (ref 37.5–51.0)
Hemoglobin: 12.7 g/dL — ABNORMAL LOW (ref 13.0–17.7)
Immature Grans (Abs): 0 10*3/uL (ref 0.0–0.1)
Immature Granulocytes: 0 %
Lymphocytes Absolute: 0.6 10*3/uL — ABNORMAL LOW (ref 0.7–3.1)
Lymphs: 29 %
MCH: 35.9 pg — ABNORMAL HIGH (ref 26.6–33.0)
MCHC: 35.3 g/dL (ref 31.5–35.7)
MCV: 102 fL — ABNORMAL HIGH (ref 79–97)
Monocytes Absolute: 0.2 10*3/uL (ref 0.1–0.9)
Monocytes: 11 %
NRBC: 1 % — ABNORMAL HIGH (ref 0–0)
Neutrophils Absolute: 1.2 10*3/uL — ABNORMAL LOW (ref 1.4–7.0)
Neutrophils: 59 %
Platelets: 38 10*3/uL — CL (ref 150–450)
RBC: 3.54 x10E6/uL — ABNORMAL LOW (ref 4.14–5.80)
RDW: 14.7 % (ref 11.6–15.4)
WBC: 2.1 10*3/uL — CL (ref 3.4–10.8)

## 2021-08-11 LAB — SEDIMENTATION RATE: Sed Rate: 2 mm/hr (ref 0–15)

## 2021-08-11 LAB — BUN+CREAT
BUN/Creatinine Ratio: 10 (ref 9–20)
BUN: 7 mg/dL (ref 6–20)
Creatinine, Ser: 0.68 mg/dL — ABNORMAL LOW (ref 0.76–1.27)
eGFR: 131 mL/min/{1.73_m2} (ref >59)

## 2021-08-11 LAB — ANTI-SMOOTH MUSCLE ANTIBODY, IGG: Smooth Muscle Ab: 6 Units (ref 0–19)

## 2021-08-11 LAB — C3 AND C4
Complement C3, Serum: 67 mg/dL — ABNORMAL LOW (ref 82–167)
Complement C4, Serum: 2 mg/dL — ABNORMAL LOW (ref 12–38)

## 2021-08-11 LAB — IGG: IgG (Immunoglobin G), Serum: 983 mg/dL (ref 603–1613)

## 2021-08-11 LAB — ANTI-MICROSOMAL ANTIBODY LIVER / KIDNEY: LKM1 Ab: 1.7 Units (ref 0.0–20.0)

## 2021-08-11 LAB — HEPATITIS B SURFACE ANTIGEN: Hepatitis B Surface Ag: NEGATIVE

## 2021-08-11 LAB — FERRITIN: Ferritin: 1986 ng/mL — ABNORMAL HIGH (ref 30–400)

## 2021-08-11 LAB — HEPATITIS A ANTIBODY, IGM: Hep A IgM: NEGATIVE

## 2021-08-11 LAB — MITOCHONDRIAL ANTIBODIES: Mitochondrial Ab: <20 Units (ref 0.0–20.0)

## 2021-08-15 ENCOUNTER — Other Ambulatory Visit (HOSPITAL_COMMUNITY): Payer: Self-pay

## 2021-08-15 ENCOUNTER — Other Ambulatory Visit (HOSPITAL_BASED_OUTPATIENT_CLINIC_OR_DEPARTMENT_OTHER): Payer: Self-pay

## 2021-08-15 MED ORDER — PREDNISONE 20 MG PO TABS
ORAL_TABLET | ORAL | 0 refills | Status: DC
  Filled 2021-08-15: qty 60, 30d supply, fill #0

## 2021-08-15 MED FILL — Belimumab Subcutaneous Solution Auto-injector 200 MG/ML: SUBCUTANEOUS | 28 days supply | Qty: 4 | Fill #5 | Status: AC

## 2021-08-19 ENCOUNTER — Other Ambulatory Visit (HOSPITAL_COMMUNITY): Payer: Self-pay

## 2021-08-20 ENCOUNTER — Other Ambulatory Visit (HOSPITAL_BASED_OUTPATIENT_CLINIC_OR_DEPARTMENT_OTHER): Payer: Self-pay

## 2021-08-22 ENCOUNTER — Other Ambulatory Visit (HOSPITAL_BASED_OUTPATIENT_CLINIC_OR_DEPARTMENT_OTHER): Payer: Self-pay

## 2021-08-22 ENCOUNTER — Telehealth (HOSPITAL_BASED_OUTPATIENT_CLINIC_OR_DEPARTMENT_OTHER): Payer: Self-pay

## 2021-08-22 DIAGNOSIS — R7989 Other specified abnormal findings of blood chemistry: Secondary | ICD-10-CM | POA: Diagnosis not present

## 2021-08-22 NOTE — Progress Notes (Signed)
Received faxed orders Dr. Rexene Agent at [facility] for the patient to have labs drawn All results need to be faxed to Dr. Aundria Rud at [phone] Orders input and req given to [facility] Phlebotomist.

## 2021-08-22 NOTE — Telephone Encounter (Signed)
Labs from rheum.

## 2021-08-26 LAB — EPSTEIN-BARR VIRUS VCA, IGM: EBV VCA IgM: <36 U/mL (ref 0.0–35.9)

## 2021-08-26 LAB — EPSTEIN-BARR VIRUS PCR: Epstein-Barr Virus Real Time: NEGATIVE

## 2021-08-26 LAB — HAPTOGLOBIN: Haptoglobin: <10 mg/dL — ABNORMAL LOW (ref 17–317)

## 2021-08-26 LAB — HSV(HERPES SIMPLEX VRS) I + II AB-IGG
HSV 1 Glycoprotein G Ab, IgG: <0.91 index (ref 0.00–0.90)
HSV 2 IgG, Type Spec: <0.91 index (ref 0.00–0.90)

## 2021-08-26 LAB — TRIGLYCERIDES: Triglycerides: 120 mg/dL (ref 0–149)

## 2021-08-28 DIAGNOSIS — R7989 Other specified abnormal findings of blood chemistry: Secondary | ICD-10-CM | POA: Diagnosis not present

## 2021-09-05 ENCOUNTER — Other Ambulatory Visit: Payer: Self-pay

## 2021-09-05 ENCOUNTER — Encounter (HOSPITAL_BASED_OUTPATIENT_CLINIC_OR_DEPARTMENT_OTHER): Payer: Self-pay | Admitting: Family Medicine

## 2021-09-05 ENCOUNTER — Ambulatory Visit (HOSPITAL_BASED_OUTPATIENT_CLINIC_OR_DEPARTMENT_OTHER): Payer: 59 | Admitting: Family Medicine

## 2021-09-05 ENCOUNTER — Other Ambulatory Visit: Payer: Self-pay | Admitting: *Deleted

## 2021-09-05 DIAGNOSIS — S0990XA Unspecified injury of head, initial encounter: Secondary | ICD-10-CM

## 2021-09-05 HISTORY — DX: Unspecified injury of head, initial encounter: S09.90XA

## 2021-09-05 NOTE — Patient Instructions (Addendum)
  Medication Instructions:  Your physician recommends that you continue on your current medications as directed. Please refer to the Current Medication list given to you today. --If you need a refill on any your medications before your next appointment, please call your pharmacy first. If no refills are authorized on file call the office.-- Lab Work: Your physician has recommended that you have lab work today:  If you have labs (blood work) drawn today and your tests are completely normal, you will receive your results via MyChart message OR a phone call from our staff.  Please ensure you check your voicemail in the event that you authorized detailed messages to be left on a delegated number. If you have any lab test that is abnormal or we need to change your treatment, we will call you to review the results.  Referrals/Procedures/Imaging:   Follow-Up: Your next appointment:  1week  Your physician recommends that you schedule a follow-up appointment in:  with Dr. de Peru  You will receive a text message or e-mail with a link to a survey about your care and experience with [place] today! We would greatly appreciate your feedback!   Thanks for letting us be apart of your health journey!!  Primary Care and Sports Medicine   Dr. Ceasar Mons [place]   We encourage you to activate your patient portal called "MyChart".  Sign up information is provided on this After Visit Summary.  MyChart is used to connect with patients for Virtual Visits (Telemedicine).  Patients are able to view lab/test results, encounter notes, upcoming appointments, etc.  Non-urgent messages can be sent to your provider as well. To learn more about what you can do with MyChart, please visit --  [email or web address].
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
## 2021-09-05 NOTE — Patient Outreach (Signed)
[facility]) Care Management  09/05/2021  Poseidon Pam 03-13-1996 [id number]   Transition of care call  Referral received: 12/1 Initial outreach: 12/1 Insurance: [place] [employer]/ [employer]  RN attempted outreach call today however unsuccessful. RN able to leave a HIPAA approved voice message requesting a call back.  Will attempt another outreach over the next week and send outreach letter accordingly.  Elliot Cousin, RN Care Management Coordinator [facility] Main Office [phone]
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
## 2021-09-07 DIAGNOSIS — R109 Unspecified abdominal pain: Secondary | ICD-10-CM | POA: Diagnosis not present

## 2021-09-07 DIAGNOSIS — K760 Fatty (change of) liver, not elsewhere classified: Secondary | ICD-10-CM | POA: Diagnosis not present

## 2021-09-07 DIAGNOSIS — D71 Functional disorders of polymorphonuclear neutrophils: Secondary | ICD-10-CM | POA: Diagnosis not present

## 2021-09-07 DIAGNOSIS — Y999 Unspecified external cause status: Secondary | ICD-10-CM | POA: Diagnosis not present

## 2021-09-07 DIAGNOSIS — R7401 Elevation of levels of liver transaminase levels: Secondary | ICD-10-CM | POA: Diagnosis not present

## 2021-09-07 DIAGNOSIS — S0990XA Unspecified injury of head, initial encounter: Secondary | ICD-10-CM | POA: Diagnosis not present

## 2021-09-07 DIAGNOSIS — S060X0A Concussion without loss of consciousness, initial encounter: Secondary | ICD-10-CM | POA: Diagnosis not present

## 2021-09-07 DIAGNOSIS — N3289 Other specified disorders of bladder: Secondary | ICD-10-CM | POA: Diagnosis not present

## 2021-09-07 DIAGNOSIS — M542 Cervicalgia: Secondary | ICD-10-CM | POA: Diagnosis not present

## 2021-09-07 DIAGNOSIS — R519 Headache, unspecified: Secondary | ICD-10-CM | POA: Diagnosis not present

## 2021-09-08 DIAGNOSIS — D71 Functional disorders of polymorphonuclear neutrophils: Secondary | ICD-10-CM | POA: Diagnosis not present

## 2021-09-08 DIAGNOSIS — K760 Fatty (change of) liver, not elsewhere classified: Secondary | ICD-10-CM | POA: Diagnosis not present

## 2021-09-08 DIAGNOSIS — R109 Unspecified abdominal pain: Secondary | ICD-10-CM | POA: Diagnosis not present

## 2021-09-08 DIAGNOSIS — N3289 Other specified disorders of bladder: Secondary | ICD-10-CM | POA: Diagnosis not present

## 2021-09-08 DIAGNOSIS — S060X0A Concussion without loss of consciousness, initial encounter: Secondary | ICD-10-CM | POA: Diagnosis not present

## 2021-09-08 DIAGNOSIS — R7401 Elevation of levels of liver transaminase levels: Secondary | ICD-10-CM | POA: Diagnosis not present

## 2021-09-10 ENCOUNTER — Other Ambulatory Visit: Payer: Self-pay | Admitting: *Deleted
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
## 2021-09-11 ENCOUNTER — Other Ambulatory Visit: Payer: Self-pay

## 2021-09-11 ENCOUNTER — Other Ambulatory Visit (HOSPITAL_BASED_OUTPATIENT_CLINIC_OR_DEPARTMENT_OTHER): Payer: Self-pay

## 2021-09-11 ENCOUNTER — Encounter (HOSPITAL_BASED_OUTPATIENT_CLINIC_OR_DEPARTMENT_OTHER): Payer: Self-pay | Admitting: Family Medicine

## 2021-09-11 ENCOUNTER — Ambulatory Visit (HOSPITAL_BASED_OUTPATIENT_CLINIC_OR_DEPARTMENT_OTHER): Payer: 59 | Admitting: Family Medicine

## 2021-09-11 VITALS — BP 160/108 | HR 88 | Ht 66.0 in | Wt 131.0 lb

## 2021-09-11 DIAGNOSIS — B2790 Infectious mononucleosis, unspecified without complication: Secondary | ICD-10-CM

## 2021-09-11 DIAGNOSIS — S0990XD Unspecified injury of head, subsequent encounter: Secondary | ICD-10-CM | POA: Diagnosis not present

## 2021-09-11 DIAGNOSIS — D61818 Other pancytopenia: Secondary | ICD-10-CM

## 2021-09-11 DIAGNOSIS — M329 Systemic lupus erythematosus, unspecified: Secondary | ICD-10-CM | POA: Diagnosis not present

## 2021-09-11 DIAGNOSIS — R7989 Other specified abnormal findings of blood chemistry: Secondary | ICD-10-CM | POA: Diagnosis not present

## 2021-09-11 NOTE — Progress Notes (Signed)
Orders placed Labs requested by Dr. Kennieth Francois at [facility] Rheumatology NPI [id number] Labs need to be forwarded to Dr. Aundria Rud for results and recommendations Fax number [phone]

## 2021-09-11 NOTE — Patient Instructions (Signed)
  Medication Instructions:  Your physician recommends that you continue on your current medications as directed. Please refer to the Current Medication list given to you today. --If you need a refill on any your medications before your next appointment, please call your pharmacy first. If no refills are authorized on file call the office.-- Follow-Up: Your next appointment:   Your physician recommends that you schedule a follow-up appointment in: 2 WEEKS with Dr. de [place]  You will receive a text message or e-mail with a link to a survey about your care and experience with [place] today! We would greatly appreciate your feedback!   Thanks for letting us be apart of your health journey!!  Primary Care and Sports Medicine   Dr. Ceasar Mons [place]   We encourage you to activate your patient portal called "MyChart".  Sign up information is provided on this After Visit Summary.  MyChart is used to connect with patients for Virtual Visits (Telemedicine).  Patients are able to view lab/test results, encounter notes, upcoming appointments, etc.  Non-urgent messages can be sent to your provider as well. To learn more about what you can do with MyChart, please visit --  [email or web address].  '
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
## 2021-09-14 LAB — CBC WITH DIFFERENTIAL/PLATELET
Basophils Absolute: 0 10*3/uL (ref 0.0–0.2)
Basos: 0 %
EOS (ABSOLUTE): 0 10*3/uL (ref 0.0–0.4)
Eos: 0 %
Hematocrit: 35.3 % — ABNORMAL LOW (ref 37.5–51.0)
Hemoglobin: 12.5 g/dL — ABNORMAL LOW (ref 13.0–17.7)
Lymphocytes Absolute: 0.8 10*3/uL (ref 0.7–3.1)
Lymphs: 21 %
MCH: 36.1 pg — ABNORMAL HIGH (ref 26.6–33.0)
MCHC: 35.4 g/dL (ref 31.5–35.7)
MCV: 102 fL — ABNORMAL HIGH (ref 79–97)
Monocytes Absolute: 0.1 10*3/uL (ref 0.1–0.9)
Monocytes: 2 %
NRBC: 1 % — ABNORMAL HIGH (ref 0–0)
Neutrophils Absolute: 2.8 10*3/uL (ref 1.4–7.0)
Neutrophils: 77 %
RBC: 3.46 x10E6/uL — ABNORMAL LOW (ref 4.14–5.80)
RDW: 12.1 % (ref 11.6–15.4)
WBC: 3.6 10*3/uL (ref 3.4–10.8)

## 2021-09-14 LAB — EPSTEIN-BARR VIRUS NUCLEAR ANTIGEN ANTIBODY, IGG: EBV NA IgG: 35.8 U/mL — ABNORMAL HIGH (ref 0.0–17.9)

## 2021-09-14 LAB — HEPATIC FUNCTION PANEL
ALT: 130 IU/L — ABNORMAL HIGH (ref 0–44)
AST: 280 IU/L — ABNORMAL HIGH (ref 0–40)
Albumin: 4.4 g/dL (ref 4.1–5.2)
Alkaline Phosphatase: 200 IU/L — ABNORMAL HIGH (ref 44–121)
Bilirubin Total: 2.3 mg/dL — ABNORMAL HIGH (ref 0.0–1.2)
Bilirubin, Direct: 1.32 mg/dL — ABNORMAL HIGH (ref 0.00–0.40)
Total Protein: 6.3 g/dL (ref 6.0–8.5)

## 2021-09-14 LAB — C-REACTIVE PROTEIN: CRP: <1 mg/L (ref 0–10)

## 2021-09-14 LAB — EPSTEIN BARR VRS(EBV DNA BY PCR): EBV DNA, Quant PCR, Plasma: POSITIVE IU/mL

## 2021-09-14 LAB — HIV ANTIBODY (ROUTINE TESTING W REFLEX): HIV Screen 4th Generation wRfx: NONREACTIVE

## 2021-09-14 LAB — EPSTEIN-BARR VIRUS VCA, IGM: EBV VCA IgM: <36 U/mL (ref 0.0–35.9)

## 2021-09-14 LAB — EBV VCA/EA AB, IGG
EBV Early Antigen Ab, IgG: 52.3 U/mL — ABNORMAL HIGH (ref 0.0–8.9)
EBV VCA IgG: >600 U/mL — ABNORMAL HIGH (ref 0.0–17.9)

## 2021-09-14 LAB — ANTI-DNA ANTIBODY, DOUBLE-STRANDED: dsDNA Ab: 5 IU/mL (ref 0–9)

## 2021-09-14 LAB — C4 COMPLEMENT: Complement C4, Serum: 4 mg/dL — ABNORMAL LOW (ref 12–38)

## 2021-09-14 LAB — C3 COMPLEMENT: Complement C3, Serum: 83 mg/dL (ref 82–167)

## 2021-09-14 LAB — FERRITIN: Ferritin: 1829 ng/mL — ABNORMAL HIGH (ref 30–400)

## 2021-10-01 DIAGNOSIS — M329 Systemic lupus erythematosus, unspecified: Secondary | ICD-10-CM | POA: Diagnosis not present

## 2021-10-01 DIAGNOSIS — D693 Immune thrombocytopenic purpura: Secondary | ICD-10-CM | POA: Diagnosis not present

## 2021-10-03 ENCOUNTER — Other Ambulatory Visit (HOSPITAL_BASED_OUTPATIENT_CLINIC_OR_DEPARTMENT_OTHER): Payer: Self-pay

## 2021-10-03 MED ORDER — DOPTELET 20 MG PO TABS: ORAL_TABLET | ORAL | 11 refills | Status: DC

## 2021-10-09 DIAGNOSIS — D61818 Other pancytopenia: Secondary | ICD-10-CM | POA: Diagnosis not present

## 2021-10-09 DIAGNOSIS — R600 Localized edema: Secondary | ICD-10-CM | POA: Diagnosis not present

## 2021-10-09 DIAGNOSIS — R7989 Other specified abnormal findings of blood chemistry: Secondary | ICD-10-CM | POA: Diagnosis not present

## 2021-10-09 DIAGNOSIS — D7589 Other specified diseases of blood and blood-forming organs: Secondary | ICD-10-CM | POA: Diagnosis not present

## 2021-10-09 DIAGNOSIS — K7689 Other specified diseases of liver: Secondary | ICD-10-CM | POA: Diagnosis not present

## 2021-10-09 DIAGNOSIS — R7401 Elevation of levels of liver transaminase levels: Secondary | ICD-10-CM | POA: Diagnosis not present

## 2021-10-09 DIAGNOSIS — D696 Thrombocytopenia, unspecified: Secondary | ICD-10-CM | POA: Diagnosis not present

## 2021-10-09 DIAGNOSIS — M329 Systemic lupus erythematosus, unspecified: Secondary | ICD-10-CM | POA: Diagnosis not present

## 2021-10-09 DIAGNOSIS — G894 Chronic pain syndrome: Secondary | ICD-10-CM | POA: Diagnosis not present

## 2021-10-17 ENCOUNTER — Other Ambulatory Visit (HOSPITAL_COMMUNITY): Payer: Self-pay

## 2021-10-19 DIAGNOSIS — D693 Immune thrombocytopenic purpura: Secondary | ICD-10-CM | POA: Diagnosis not present

## 2021-11-06 ENCOUNTER — Other Ambulatory Visit (HOSPITAL_COMMUNITY): Payer: Self-pay

## 2021-11-18 ENCOUNTER — Other Ambulatory Visit: Payer: Self-pay

## 2021-11-18 ENCOUNTER — Telehealth (HOSPITAL_BASED_OUTPATIENT_CLINIC_OR_DEPARTMENT_OTHER): Payer: Self-pay | Admitting: Family Medicine

## 2021-11-18 ENCOUNTER — Ambulatory Visit (HOSPITAL_BASED_OUTPATIENT_CLINIC_OR_DEPARTMENT_OTHER): Payer: 59 | Admitting: Family Medicine

## 2021-11-18 ENCOUNTER — Encounter (HOSPITAL_BASED_OUTPATIENT_CLINIC_OR_DEPARTMENT_OTHER): Payer: Self-pay | Admitting: Family Medicine

## 2021-11-18 VITALS — BP 152/112 | HR 100 | Ht 66.0 in | Wt 135.0 lb

## 2021-11-18 DIAGNOSIS — R42 Dizziness and giddiness: Secondary | ICD-10-CM | POA: Insufficient documentation

## 2021-11-18 DIAGNOSIS — I10 Essential (primary) hypertension: Secondary | ICD-10-CM | POA: Diagnosis not present

## 2021-11-18 NOTE — Telephone Encounter (Signed)
LVM to pt to make an appt from after hours fax came threw on 2/10 at 3:41, Regarding pt not feeling well. Also stated that the prednisone 40mg  is not helping. Please advise.

## 2021-11-18 NOTE — Patient Instructions (Signed)
°  Medication Instructions:  Your physician recommends that you continue on your current medications as directed. Please refer to the Current Medication list given to you today. --If you need a refill on any your medications before your next appointment, please call your pharmacy first. If no refills are authorized on file call the office.-- Lab Work: Your physician has recommended that you have lab work today: BMET If you have labs (blood work) drawn today and your tests are completely normal, you will receive your results via MyChart message OR a phone call from our staff.  Please ensure you check your voicemail in the event that you authorized detailed messages to be left on a delegated number. If you have any lab test that is abnormal or we need to change your treatment, we will call you to review the results.  Follow-Up: Your next appointment:   Your physician recommends that you schedule a follow-up appointment in: 2 MONTHS with Dr. de [place]  You will receive a text message or e-mail with a link to a survey about your care and experience with [place] today! We would greatly appreciate your feedback!   Thanks for letting us be apart of your health journey!!  Primary Care and Sports Medicine   Dr. Ceasar Mons [place]   We encourage you to activate your patient portal called "MyChart".  Sign up information is provided on this After Visit Summary.  MyChart is used to connect with patients for Virtual Visits (Telemedicine).  Patients are able to view lab/test results, encounter notes, upcoming appointments, etc.  Non-urgent messages can be sent to your provider as well. To learn more about what you can do with MyChart, please visit --  [email or web address].

## 2021-11-18 NOTE — Progress Notes (Signed)
° ° °  Procedures performed today:    None.  Independent interpretation of notes and tests performed by another provider:   None.  Brief History, Exam, Impression, and Recommendations:    BP (!) 152/112    Pulse 100    Ht 5\' 6"  (1.676 m)    Wt 135 lb (61.2 kg)    SpO2 98%    BMI 21.79 kg/m   Dizziness Patient reports over the past couple weeks he has had increased weakness, fatigue, dizziness, headaches, hair thinning, joint pains.  He also endorses decreased appetite.  He indicates that he did reach out to his rheumatologist regarding the symptoms and was advised that it may be related to electrolyte disturbance as patient has had this in the past and reportedly they recommended evaluation with PCP regarding this.  Patient specifically has some concerns about low potassium as this is what was found previously with similar symptoms.  Of note, patient is currently on prednisone taper.  Was taking 40 mg daily, has transition to 20 mg daily with further taper planned over the coming weeks On exam, lungs clear to auscultation bilaterally, borderline tachycardia, regular rhythm. We will check labs today to evaluate for any electrolyte disturbance Discussed that symptoms do seem consistent with potential side effects related to prednisone use Recommend further evaluation with rheumatologist if labs found to be reassuring  Hypertension Not at goal today, suspect that this is related to ongoing use of prednisone, currently tapering under direction of rheumatologist Will need to continue to monitor closely moving forward, recommend intermittent home monitoring  Plan for follow-up in about 2 to 3 months   ___________________________________________ Mindy Behnken de [place], MD, ABFM, CAQSM Primary Care and [place]

## 2021-11-18 NOTE — Telephone Encounter (Signed)
Spoke with patient, he states he contacted his rheumatologist office to report his symptoms and they told him that his potassium was probably low and that he should see his PCP Advised patient that we don't have any recent potassium levels on him so he would need to schedule an appointment to discuss symptoms and labs Patient is agreeable and verbalized understanding Patient transferred to registration to schedule.

## 2021-11-19 LAB — BASIC METABOLIC PANEL
BUN/Creatinine Ratio: 11 (ref 9–20)
BUN: 6 mg/dL (ref 6–20)
CO2: 20 mmol/L (ref 20–29)
Calcium: 8.4 mg/dL — ABNORMAL LOW (ref 8.7–10.2)
Chloride: 96 mmol/L (ref 96–106)
Creatinine, Ser: 0.54 mg/dL — ABNORMAL LOW (ref 0.76–1.27)
Glucose: 98 mg/dL (ref 70–99)
Potassium: 3.5 mmol/L (ref 3.5–5.2)
Sodium: 135 mmol/L (ref 134–144)
eGFR: 139 mL/min/{1.73_m2} (ref >59)

## 2021-11-19 NOTE — Assessment & Plan Note (Signed)
Not at goal today, suspect that this is related to ongoing use of prednisone, currently tapering under direction of rheumatologist Will need to continue to monitor closely moving forward, recommend intermittent home monitoring

## 2021-11-19 NOTE — Assessment & Plan Note (Signed)
Patient reports over the past couple weeks he has had increased weakness, fatigue, dizziness, headaches, hair thinning, joint pains.  He also endorses decreased appetite.  He indicates that he did reach out to his rheumatologist regarding the symptoms and was advised that it may be related to electrolyte disturbance as patient has had this in the past and reportedly they recommended evaluation with PCP regarding this.  Patient specifically has some concerns about low potassium as this is what was found previously with similar symptoms.  Of note, patient is currently on prednisone taper.  Was taking 40 mg daily, has transition to 20 mg daily with further taper planned over the coming weeks On exam, lungs clear to auscultation bilaterally, borderline tachycardia, regular rhythm. We will check labs today to evaluate for any electrolyte disturbance Discussed that symptoms do seem consistent with potential side effects related to prednisone use Recommend further evaluation with rheumatologist if labs found to be reassuring

## 2021-11-20 ENCOUNTER — Telehealth (HOSPITAL_BASED_OUTPATIENT_CLINIC_OR_DEPARTMENT_OTHER): Payer: Self-pay | Admitting: Family Medicine

## 2021-11-20 NOTE — Telephone Encounter (Signed)
Pt stated he talked to provider about needing CBC labs ordered so he can get that checked.  Please advise.

## 2021-12-18 ENCOUNTER — Other Ambulatory Visit (HOSPITAL_BASED_OUTPATIENT_CLINIC_OR_DEPARTMENT_OTHER): Payer: Self-pay

## 2021-12-18 ENCOUNTER — Ambulatory Visit (HOSPITAL_BASED_OUTPATIENT_CLINIC_OR_DEPARTMENT_OTHER): Payer: 59 | Admitting: Family Medicine

## 2021-12-18 ENCOUNTER — Other Ambulatory Visit: Payer: Self-pay

## 2021-12-18 ENCOUNTER — Ambulatory Visit (HOSPITAL_BASED_OUTPATIENT_CLINIC_OR_DEPARTMENT_OTHER): Payer: 59

## 2021-12-18 ENCOUNTER — Encounter (HOSPITAL_BASED_OUTPATIENT_CLINIC_OR_DEPARTMENT_OTHER): Payer: Self-pay | Admitting: Family Medicine

## 2021-12-18 VITALS — BP 130/100 | HR 97 | Ht 67.0 in | Wt 140.0 lb

## 2021-12-18 DIAGNOSIS — R7401 Elevation of levels of liver transaminase levels: Secondary | ICD-10-CM | POA: Diagnosis not present

## 2021-12-18 DIAGNOSIS — D696 Thrombocytopenia, unspecified: Secondary | ICD-10-CM

## 2021-12-18 DIAGNOSIS — Z789 Other specified health status: Secondary | ICD-10-CM

## 2021-12-18 NOTE — Assessment & Plan Note (Signed)
Particularly given recent laboratory findings, I recommended to patient that he avoid all alcohol consumption, particularly until further evaluation with gastroenterology ?

## 2021-12-18 NOTE — Progress Notes (Signed)
? ? ?  Procedures performed today:   ? ?None. ? ?Independent interpretation of notes and tests performed by another provider:  ? ?None. ? ?Brief History, Exam, Impression, and Recommendations:   ? ?BP (!) 130/100   Pulse 97   Ht 5\' 7"  (1.702 m)   Wt 140 lb (63.5 kg)   SpO2 100%   BMI 21.93 kg/m?  ? ?Transaminitis ?Patient presenting with concerns of recently elevated liver enzymes as well as other laboratory abnormalities.  Patient has history of intermittent elevation of aminotransferases as well as bilirubin in the past.  He did meet with a [facility] gastroenterologist in November 2022.  He was also recommended to have hospital admission for further evaluation due to severe thrombocytopenia as well as abnormal hepatic panel.  However, at that time patient declined admission and did not proceed with further evaluation. ?At work here, the laboratory department downstairs was requesting volunteers for labs and patient volunteered for this.  The labs that they completed showed elevated AST, ALT, elevated total and direct bilirubin, thrombocytopenia.  Due to these findings, patient was concerned and thus arranged for this visit.  Due to his chronic medical issues, patient has been on corticosteroids for many years at various dosages.  Continues with prednisone at present.  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  Indicates that at present he has been drinking about 5-6 shots of liquor per day over the past few years.  His last drink was about 4 to 5 days ago.  Denies any symptoms since his last drink. ?In reviewing his labs, his AST is about 2 times his ALT which can be seen with chronic alcohol use.  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 ?Patient does have upcoming appointment with hematologist, advised that he reach out to them to make them aware of his recent laboratory findings and if they would like any other testing completed or to see him for an office visit sooner ? ?Hyperbilirubinemia ?Noted on recent labs, hyperbilirubinemia is of a mixed etiology ?Recommend further evaluation with gastroenterologist/hepatologist as above, referral placed ?Patient has seen Dr. December 2022 with [facility] gastroenterology in the past, advised that he may be able to schedule follow-up appointment without necessarily needing a referral placed, patient indicated that he will proceed with contacting their office to arrange ? ?Alcohol use ?Particularly given recent laboratory findings, I recommended to patient that he avoid all alcohol consumption, particularly until further evaluation with gastroenterology ? ?Thrombocytopenia (HCC) ?Given findings of notable thrombocytopenia on recent labs at 36,000, recommend that he reach out to his hematologist office to make them aware and determine if they would recommend any further testing or sooner follow-up.  He is scheduled to follow-up with them later this month. ? ?Plan for follow-up at next scheduled appointment next month. ? ? ?___________________________________________ ?Thanos Cousineau de Corky Sing, MD, ABFM, CAQSM ?Primary Care and Sports Medicine ?[facility] ?

## 2021-12-18 NOTE — Assessment & Plan Note (Signed)
Patient presenting with concerns of recently elevated liver enzymes as well as other laboratory abnormalities.  Patient has history of intermittent elevation of aminotransferases as well as bilirubin in the past.  He did meet with a [facility] gastroenterologist in November 2022.  He was also recommended to have hospital admission for further evaluation due to severe thrombocytopenia as well as abnormal hepatic panel.  However, at that time patient declined admission and did not proceed with further evaluation. ?At work here, the laboratory department downstairs was requesting volunteers for labs and patient volunteered for this.  The labs that they completed showed elevated AST, ALT, elevated total and direct bilirubin, thrombocytopenia.  Due to these findings, patient was concerned and thus arranged for this visit.  Due to his chronic medical issues, patient has been on corticosteroids for many years at various dosages.  Continues with prednisone at present.  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  Indicates that at present he has been drinking about 5-6 shots of liquor per day over the past few years.  His last drink was about 4 to 5 days ago.  Denies any symptoms since his last drink. ?In reviewing his labs, his AST is about 2 times his ALT which can be seen with chronic alcohol use.  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 ?Patient does have upcoming appointment with hematologist, advised that he reach out to them to make them aware of  his recent laboratory findings and if they would like any other testing completed or to see him for an office visit sooner ?

## 2021-12-18 NOTE — Assessment & Plan Note (Signed)
Noted on recent labs, hyperbilirubinemia is of a mixed etiology ?Recommend further evaluation with gastroenterologist/hepatologist as above, referral placed ?Patient has seen Dr. Damita Lack with [place] gastro[facility] in the past, advised that he may be able to schedule follow-up appointment without necessarily needing a referral placed, patient indicated that he will proceed with contacting their office to arrange ?

## 2021-12-18 NOTE — Assessment & Plan Note (Signed)
Given findings of notable thrombocytopenia on recent labs at 36,000, recommend that he reach out to his hematologist office to make them aware and determine if they would recommend any further testing or sooner follow-up.  He is scheduled to follow-up with them later this month. ?

## 2021-12-18 NOTE — Patient Instructions (Signed)
?  Medication Instructions:  ?Your physician recommends that you continue on your current medications as directed. Please refer to the Current Medication list given to you today. ?--If you need a refill on any your medications before your next appointment, please call your pharmacy first. If no refills are authorized on file call the office.-- ? ? ?Referrals/Procedures/Imaging: ?Call Hemotology ?GI @ [facility] ? ?Follow-Up: ?Your next appointment:   ?Your physician recommends that you schedule a follow-up appointment in: keep appointment with Dr. de Peru ? ?You will receive a text message or e-mail with a link to a survey about your care and experience with [place] today! We would greatly appreciate your feedback!  ? ?Thanks for letting us be apart of your health journey!!  ?Primary Care and Sports Medicine  ? ?Dr. Marcy Salvo de [place]  ? ?We encourage you to activate your patient portal called "MyChart".  Sign up information is provided on this After Visit Summary.  MyChart is used to connect with patients for Virtual Visits (Telemedicine).  Patients are able to view lab/test results, encounter notes, upcoming appointments, etc.  Non-urgent messages can be sent to your provider as well. To learn more about what you can do with MyChart, please visit --  [email or web address].    ?

## 2021-12-27 ENCOUNTER — Other Ambulatory Visit: Payer: Self-pay

## 2021-12-27 ENCOUNTER — Ambulatory Visit (HOSPITAL_BASED_OUTPATIENT_CLINIC_OR_DEPARTMENT_OTHER): Payer: 59

## 2022-01-02 DIAGNOSIS — D693 Immune thrombocytopenic purpura: Secondary | ICD-10-CM | POA: Diagnosis not present

## 2022-01-16 ENCOUNTER — Ambulatory Visit (HOSPITAL_BASED_OUTPATIENT_CLINIC_OR_DEPARTMENT_OTHER): Payer: 59 | Admitting: Family Medicine

## 2022-01-21 ENCOUNTER — Ambulatory Visit (HOSPITAL_BASED_OUTPATIENT_CLINIC_OR_DEPARTMENT_OTHER): Payer: 59 | Admitting: Family Medicine

## 2022-01-23 ENCOUNTER — Other Ambulatory Visit (HOSPITAL_BASED_OUTPATIENT_CLINIC_OR_DEPARTMENT_OTHER): Payer: Self-pay

## 2022-01-24 ENCOUNTER — Other Ambulatory Visit (HOSPITAL_BASED_OUTPATIENT_CLINIC_OR_DEPARTMENT_OTHER): Payer: Self-pay | Admitting: Nurse Practitioner

## 2022-01-24 DIAGNOSIS — R7401 Elevation of levels of liver transaminase levels: Secondary | ICD-10-CM

## 2022-01-29 ENCOUNTER — Ambulatory Visit (HOSPITAL_BASED_OUTPATIENT_CLINIC_OR_DEPARTMENT_OTHER): Payer: 59

## 2022-01-29 ENCOUNTER — Ambulatory Visit (HOSPITAL_BASED_OUTPATIENT_CLINIC_OR_DEPARTMENT_OTHER)
Admission: RE | Admit: 2022-01-29 | Discharge: 2022-01-29 | Disposition: A | Discharge status: Home / Self Care | Payer: 59 | Source: Ambulatory Visit | Attending: Nurse Practitioner | Admitting: Nurse Practitioner

## 2022-01-29 DIAGNOSIS — R7401 Elevation of levels of liver transaminase levels: Secondary | ICD-10-CM | POA: Insufficient documentation

## 2022-01-29 DIAGNOSIS — R932 Abnormal findings on diagnostic imaging of liver and biliary tract: Secondary | ICD-10-CM | POA: Diagnosis not present

## 2022-01-29 DIAGNOSIS — R748 Abnormal levels of other serum enzymes: Secondary | ICD-10-CM | POA: Diagnosis not present

## 2022-01-30 ENCOUNTER — Ambulatory Visit (HOSPITAL_BASED_OUTPATIENT_CLINIC_OR_DEPARTMENT_OTHER): Payer: 59 | Admitting: Family Medicine

## 2022-02-01 ENCOUNTER — Encounter (HOSPITAL_BASED_OUTPATIENT_CLINIC_OR_DEPARTMENT_OTHER): Payer: Self-pay | Admitting: Emergency Medicine

## 2022-02-01 ENCOUNTER — Emergency Department (HOSPITAL_BASED_OUTPATIENT_CLINIC_OR_DEPARTMENT_OTHER)
Admission: EM | Admit: 2022-02-01 | Discharge: 2022-02-01 | Disposition: A | Discharge status: Home / Self Care | Payer: 59 | Attending: Emergency Medicine | Admitting: Emergency Medicine

## 2022-02-01 ENCOUNTER — Other Ambulatory Visit: Payer: Self-pay

## 2022-02-01 ENCOUNTER — Emergency Department (HOSPITAL_BASED_OUTPATIENT_CLINIC_OR_DEPARTMENT_OTHER): Payer: 59

## 2022-02-01 DIAGNOSIS — R404 Transient alteration of awareness: Secondary | ICD-10-CM | POA: Diagnosis not present

## 2022-02-01 DIAGNOSIS — R5383 Other fatigue: Secondary | ICD-10-CM | POA: Insufficient documentation

## 2022-02-01 DIAGNOSIS — R000 Tachycardia, unspecified: Secondary | ICD-10-CM | POA: Diagnosis not present

## 2022-02-01 DIAGNOSIS — E7220 Disorder of urea cycle metabolism, unspecified: Secondary | ICD-10-CM | POA: Diagnosis not present

## 2022-02-01 DIAGNOSIS — I10 Essential (primary) hypertension: Secondary | ICD-10-CM | POA: Diagnosis not present

## 2022-02-01 DIAGNOSIS — R42 Dizziness and giddiness: Secondary | ICD-10-CM | POA: Insufficient documentation

## 2022-02-01 DIAGNOSIS — R52 Pain, unspecified: Secondary | ICD-10-CM

## 2022-02-01 DIAGNOSIS — R110 Nausea: Secondary | ICD-10-CM | POA: Diagnosis not present

## 2022-02-01 DIAGNOSIS — R079 Chest pain, unspecified: Secondary | ICD-10-CM | POA: Diagnosis not present

## 2022-02-01 LAB — COMPREHENSIVE METABOLIC PANEL
ALT: 37 U/L (ref 0–44)
AST: 69 U/L — ABNORMAL HIGH (ref 15–41)
Albumin: 4.3 g/dL (ref 3.5–5.0)
Alkaline Phosphatase: 152 U/L — ABNORMAL HIGH (ref 38–126)
Anion gap: 11 (ref 5–15)
BUN: 11 mg/dL (ref 6–20)
CO2: 29 mmol/L (ref 22–32)
Calcium: 8.6 mg/dL — ABNORMAL LOW (ref 8.9–10.3)
Chloride: 108 mmol/L (ref 98–111)
Creatinine, Ser: 0.57 mg/dL — ABNORMAL LOW (ref 0.61–1.24)
GFR, Estimated: >60 mL/min (ref >60)
Glucose, Bld: 79 mg/dL (ref 70–99)
Potassium: 3.5 mmol/L (ref 3.5–5.1)
Sodium: 148 mmol/L — ABNORMAL HIGH (ref 135–145)
Total Bilirubin: 1.2 mg/dL (ref 0.3–1.2)
Total Protein: 7.2 g/dL (ref 6.5–8.1)

## 2022-02-01 LAB — URINALYSIS, ROUTINE W REFLEX MICROSCOPIC
Bilirubin Urine: NEGATIVE
Glucose, UA: NEGATIVE mg/dL
Hgb urine dipstick: NEGATIVE
Ketones, ur: NEGATIVE mg/dL
Leukocytes,Ua: NEGATIVE
Nitrite: NEGATIVE
Protein, ur: 30 mg/dL — AB
Specific Gravity, Urine: 1.025 (ref 1.005–1.030)
pH: 6 (ref 5.0–8.0)

## 2022-02-01 LAB — CBC WITH DIFFERENTIAL/PLATELET
Abs Immature Granulocytes: 0.01 10*3/uL (ref 0.00–0.07)
Basophils Absolute: 0 10*3/uL (ref 0.0–0.1)
Basophils Relative: 0 %
Eosinophils Absolute: 0 10*3/uL (ref 0.0–0.5)
Eosinophils Relative: 0 %
HCT: 40.4 % (ref 39.0–52.0)
Hemoglobin: 13.4 g/dL (ref 13.0–17.0)
Immature Granulocytes: 0 %
Lymphocytes Relative: 54 %
Lymphs Abs: 1.9 10*3/uL (ref 0.7–4.0)
MCH: 32.9 pg (ref 26.0–34.0)
MCHC: 33.2 g/dL (ref 30.0–36.0)
MCV: 99.3 fL (ref 80.0–100.0)
Monocytes Absolute: 0.6 10*3/uL (ref 0.1–1.0)
Monocytes Relative: 18 %
Neutro Abs: 1 10*3/uL — ABNORMAL LOW (ref 1.7–7.7)
Neutrophils Relative %: 28 %
Platelets: 87 10*3/uL — ABNORMAL LOW (ref 150–400)
RBC: 4.07 MIL/uL — ABNORMAL LOW (ref 4.22–5.81)
RDW: 13.8 % (ref 11.5–15.5)
WBC: 3.5 10*3/uL — ABNORMAL LOW (ref 4.0–10.5)
nRBC: 0 % (ref 0.0–0.2)

## 2022-02-01 LAB — PROTIME-INR
INR: 1.1 (ref 0.8–1.2)
Prothrombin Time: 14.3 seconds (ref 11.4–15.2)

## 2022-02-01 LAB — MAGNESIUM: Magnesium: 2.2 mg/dL (ref 1.7–2.4)

## 2022-02-01 LAB — LIPASE, BLOOD: Lipase: 40 U/L (ref 11–51)

## 2022-02-01 LAB — AMMONIA: Ammonia: 71 umol/L — ABNORMAL HIGH (ref 9–35)

## 2022-02-01 LAB — LACTIC ACID, PLASMA
Lactic Acid, Venous: 2.2 mmol/L (ref 0.5–1.9)
Lactic Acid, Venous: 2.6 mmol/L (ref 0.5–1.9)

## 2022-02-01 LAB — TSH: TSH: 0.505 u[IU]/mL (ref 0.350–4.500)

## 2022-02-01 MED ORDER — SODIUM CHLORIDE 0.9 % IV BOLUS
1000.0000 mL | Freq: Once | INTRAVENOUS | Status: AC
Start: 2022-02-01 — End: 2022-02-01
  Administered 2022-02-01: 1000 mL via INTRAVENOUS

## 2022-02-01 MED ORDER — LACTULOSE 10 GM/15ML PO SOLN: 20.0000 g | Freq: Every day | ORAL | 0 refills | Status: AC

## 2022-02-01 MED ORDER — HYDROMORPHONE HCL 1 MG/ML IJ SOLN
1.0000 mg | Freq: Once | INTRAMUSCULAR | Status: AC
  Administered 2022-02-01: 1 mg via INTRAVENOUS
  Filled 2022-02-01: qty 1

## 2022-02-01 NOTE — ED Triage Notes (Signed)
Pt has history of lupus, c/o fatigue, nausea and weakness onset this morning. ?

## 2022-02-01 NOTE — Discharge Instructions (Signed)
Your history, exam, work-up today revealed elevated ammonia likely related to some of your chronic liver troubles.  Your liver function however was improved from prior.  I suspect your flareup was due to the alcohol you drink in celebration however since your symptoms will improve, we do feel you are safe for discharge home.  Please continue your home steroid medication and take the lactulose to help with the ammonia.  Please follow-up with your PCP in the next several days to discuss further management.  If any symptoms change or worsen acutely, please return to the nearest emergency department. ?

## 2022-02-01 NOTE — ED Provider Notes (Signed)
?[facility] EMERGENCY DEPT ?Provider Note ? ? ?CSN: [id number] ?Arrival date & time: 02/01/22  1415 ? ?  ? ?History ? ?Chief Complaint  ?Patient presents with  ? Fatigue  ? ? ?Chase Townsend is a 26 y.o. male. ? ?The history is provided by the patient and medical records. No language interpreter was used.  ?Illness ?Location:  Diffuse joint pains and fatigue ?Quality:  Severe ?Severity:  Severe ?Onset quality:  Sudden ?Duration: several. ?Timing:  Constant ?Progression:  Improving ?Chronicity:  New ?Associated symptoms: fatigue and nausea   ?Associated symptoms: no abdominal pain, no chest pain, no congestion, no cough, no diarrhea, no fever, no headaches, no loss of consciousness, no rash, no rhinorrhea, no shortness of breath, no vomiting and no wheezing   ? ?  ? ?Home Medications ?Prior to Admission medications   ?Medication Sig Start Date End Date Taking? Authorizing Provider  ?acetaminophen (TYLENOL) 650 MG CR tablet Take 650 mg by mouth every 8 (eight) hours as needed for pain.    [provider]  ?Avatrombopag Maleate (DOPTELET) 20 MG TABS Take 40 mg by mouth once daily 10/03/21     ?carvedilol (COREG) 12.5 MG tablet Take 1 tablet (12.5 mg total) by mouth 2 (two) times daily. 05/06/21   de [place], Raymond J, MD  ?COVID-19 At Home Antigen Test KIT Use as directed. 07/02/21   Sandria Manly, [facility]  ?hydroxychloroquine (PLAQUENIL) 200 MG tablet Take 200 mg by mouth daily.     [provider]  ?loratadine (CLARITIN) 10 MG tablet Take 10 mg by mouth daily as needed for allergies.     [provider]  ?omega-3 fish oil (MAXEPA) 1000 MG CAPS capsule Take 1 capsule by mouth daily.    [provider]  ?potassium chloride SA (KLOR-CON) 20 MEQ tablet Take 1 tablet (20 mEq total) by mouth 2 (two) times daily. 05/17/21   Vanetta Mulders, MD  ?predniSONE (DELTASONE) 20 MG tablet Take 2 tablets (40 mg total) by mouth once daily 08/15/21     ?predniSONE (DELTASONE) 5 MG tablet Take 5 mg  by mouth daily with breakfast.     [provider]  ?sulfamethoxazole-trimethoprim (BACTRIM DS) 800-160 MG tablet Take 1 tablet by mouth 2 (two) times daily. 07/02/21   de [place], Raymond J, MD  ?traMADol (ULTRAM) 50 MG tablet Take 50 mg by mouth as needed. 01/29/18   [provider]  ?   ? ?Allergies    ?Montelukast, Aspirin, Azathioprine, Ibuprofen, Singulair [montelukast sodium], and Amoxicillin-pot clavulanate   ? ?Review of Systems   ?Review of Systems  ?Constitutional:  Positive for fatigue. Negative for chills, diaphoresis and fever.  ?HENT:  Negative for congestion and rhinorrhea.   ?Eyes:  Negative for visual disturbance.  ?Respiratory:  Negative for cough, chest tightness, shortness of breath, wheezing and stridor.   ?Cardiovascular:  Negative for chest pain, palpitations and leg swelling.  ?Gastrointestinal:  Positive for nausea. Negative for abdominal pain, constipation, diarrhea and vomiting.  ?Genitourinary:  Positive for frequency. Negative for dysuria and flank pain.  ?Musculoskeletal:  Negative for back pain, neck pain and neck stiffness.  ?Skin:  Negative for rash and wound.  ?Neurological:  Positive for light-headedness. Negative for dizziness, tremors, loss of consciousness, facial asymmetry, weakness, numbness and headaches.  ?Psychiatric/Behavioral:  Negative for agitation and confusion.   ?All other systems reviewed and are negative. ? ?Physical Exam ?Updated Vital Signs ?BP (!) 136/99   Pulse 88   Temp 97.8 ?F (36.6 ?C)  Resp 16   Ht $R'5\' 7"'mI$  (1.702 m)   Wt 61.7 kg   SpO2 100%   BMI 21.30 kg/m?  ?Physical Exam ?Vitals and nursing note reviewed.  ?Constitutional:   ?   General: He is not in acute distress. ?   Appearance: He is well-developed. He is not ill-appearing, toxic-appearing or diaphoretic.  ?HENT:  ?   Head: Normocephalic and atraumatic.  ?   Nose: Nose normal. No congestion or rhinorrhea.  ?   Mouth/Throat:  ?   Mouth: Mucous membranes are dry.  ?   Pharynx: No  oropharyngeal exudate or posterior oropharyngeal erythema.  ?Eyes:  ?   Extraocular Movements: Extraocular movements intact.  ?   Conjunctiva/sclera: Conjunctivae normal.  ?   Pupils: Pupils are equal, round, and reactive to light.  ?Neck:  ?   Vascular: No carotid bruit.  ?Cardiovascular:  ?   Rate and Rhythm: Normal rate and regular rhythm.  ?   Pulses: Normal pulses.  ?   Heart sounds: No murmur heard. ?Pulmonary:  ?   Effort: Pulmonary effort is normal. No respiratory distress.  ?   Breath sounds: Normal breath sounds. No wheezing, rhonchi or rales.  ?Chest:  ?   Chest wall: No tenderness.  ?Abdominal:  ?   General: Abdomen is flat. There is no distension.  ?   Palpations: Abdomen is soft.  ?   Tenderness: There is no abdominal tenderness. There is no right CVA tenderness, left CVA tenderness, guarding or rebound.  ?Musculoskeletal:     ?   General: No swelling or tenderness.  ?   Cervical back: Neck supple. No tenderness.  ?   Right lower leg: No edema.  ?   Left lower leg: No edema.  ?Skin: ?   General: Skin is warm and dry.  ?   Capillary Refill: Capillary refill takes less than 2 seconds.  ?   Findings: No erythema or rash.  ?Neurological:  ?   General: No focal deficit present.  ?   Mental Status: He is alert and oriented to person, place, and time.  ?   Sensory: No sensory deficit.  ?   Motor: No weakness.  ?Psychiatric:     ?   Mood and Affect: Mood normal.  ? ? ?ED Results / Procedures / Treatments   ?Labs ?(all labs ordered are listed, but only abnormal results are displayed) ?Labs Reviewed  ?CBC WITH DIFFERENTIAL/PLATELET - Abnormal; Notable for the following components:  ?    Result Value  ? WBC 3.5 (*)   ? RBC 4.07 (*)   ? Platelets 87 (*)   ? Neutro Abs 1.0 (*)   ? All other components within normal limits  ?COMPREHENSIVE METABOLIC PANEL - Abnormal; Notable for the following components:  ? Sodium 148 (*)   ? Creatinine, Ser 0.57 (*)   ? Calcium 8.6 (*)   ? AST 69 (*)   ? Alkaline Phosphatase 152 (*)    ? All other components within normal limits  ?LACTIC ACID, PLASMA - Abnormal; Notable for the following components:  ? Lactic Acid, Venous 2.2 (*)   ? All other components within normal limits  ?LACTIC ACID, PLASMA - Abnormal; Notable for the following components:  ? Lactic Acid, Venous 2.6 (*)   ? All other components within normal limits  ?URINALYSIS, ROUTINE W REFLEX MICROSCOPIC - Abnormal; Notable for the following components:  ? Protein, ur 30 (*)   ? All other components within normal limits  ?  AMMONIA - Abnormal; Notable for the following components:  ? Ammonia 71 (*)   ? All other components within normal limits  ?URINE CULTURE  ?LIPASE, BLOOD  ?MAGNESIUM  ?TSH  ?PROTIME-INR  ? ? ?EKG ?EKG Interpretation ? ?Date/Time:  Saturday February 01 2022 16:21:06 EDT ?Ventricular Rate:  101 ?PR Interval:  174 ?QRS Duration: 97 ?QT Interval:  400 ?QTC Calculation: 519 ?R Axis:   10 ?Text Interpretation: Sinus tachycardia RSR' in V1 or V2, probably normal variant Borderline T wave abnormalities Prolonged QT interval when compared to prior,? similar appearnce? with longer QTC. No STEMI Confirmed by Antony Blackbird [phone]) on 02/01/2022 4:52:19 PM ? ?Radiology ?DG Chest Portable 1 View ? ?Result Date: 02/01/2022 ?CLINICAL DATA:  Diffuse pain. Rule out infection. History of lupus. Fatigue, nausea and weakness. EXAM: PORTABLE CHEST 1 VIEW COMPARISON:  04/29/2021 FINDINGS: Heart size and mediastinal contours are unremarkable. Calcified granulomas identified within the left midlung and right lower lung. No pleural effusion or edema. No signs of airspace consolidation. Osseous structures appear intact. IMPRESSION: No acute cardiopulmonary abnormalities. Electronically Signed   By: Kerby Moors M.D.   On: 02/01/2022 17:07   ? ?Procedures ?Procedures  ? ? ?Medications Ordered in ED ?Medications  ?sodium chloride 0.9 % bolus 1,000 mL (0 mLs Intravenous Stopped 02/01/22 1740)  ?HYDROmorphone (DILAUDID) injection 1 mg (1 mg Intravenous  Given 02/01/22 1618)  ? ? ?ED Course/ Medical Decision Making/ A&P ?  ?                        ?Medical Decision Making ?Amount and/or Complexity of Data Reviewed ?Labs: ordered. ?Radiology: ordered. ? ?Risk

## 2022-02-03 ENCOUNTER — Ambulatory Visit (HOSPITAL_BASED_OUTPATIENT_CLINIC_OR_DEPARTMENT_OTHER): Payer: 59 | Admitting: Family Medicine

## 2022-02-03 LAB — URINE CULTURE: Culture: NO GROWTH

## 2022-02-05 ENCOUNTER — Ambulatory Visit (INDEPENDENT_AMBULATORY_CARE_PROVIDER_SITE_OTHER): Payer: 59 | Admitting: Family Medicine

## 2022-02-05 ENCOUNTER — Encounter (HOSPITAL_BASED_OUTPATIENT_CLINIC_OR_DEPARTMENT_OTHER): Payer: Self-pay | Admitting: Family Medicine

## 2022-02-05 VITALS — BP 183/137 | HR 102 | Ht 67.0 in | Wt 134.2 lb

## 2022-02-05 DIAGNOSIS — M25552 Pain in left hip: Secondary | ICD-10-CM | POA: Diagnosis not present

## 2022-02-05 DIAGNOSIS — R7401 Elevation of levels of liver transaminase levels: Secondary | ICD-10-CM | POA: Diagnosis not present

## 2022-02-05 NOTE — Assessment & Plan Note (Addendum)
Has been going on for about 4 weeks, has worsened over the past few days. ?No inciting event recalled. Has had some stiffness with it. Mostly located over the anterior hip. No specific aggravating factors.  Has tried OTC medications, prescribed pain medications without significant relief.  Denies any prior left hip issues. ?On exam, no significant tenderness to palpation over lateral or anterior hip.  Mild pain with resisted flexion, mild pain with abduction and adduction.  Mild pain with FADIR, positive FABER.  Pain with logroll. ?Given current symptoms, will proceed with x-ray imaging to further assess for intra-articular pathology ?Can continue with conservative measures to help with pain control ?Further recommendations pending results of imaging ?

## 2022-02-05 NOTE — Assessment & Plan Note (Signed)
Patient with prior laboratory findings with evidence of impaired liver function, elevated liver enzymes.  He has also had recent ultrasound pleated which was ordered through his hematologist which showed findings within the liver that were suggestive of hepatic steatosis or possible other chronic liver disease.  He has previously been referred to GI/hepatology, however has yet to establish care.  Most recently, his hematologist did provide another referral for patient to establish, he has not done so as of yet.  Prior referral was to establish with hepatologist at [facility]. ?Discussed again today with patient importance of establishing with hepatologist and need for further evaluation and recommendations by specialist.  Patient voiced understanding and indicates that he will reach out to schedule establishing visit.  Advised that if he has difficulty with finding contact information, to reach out to his hematologist office as they placed referral and should be able to provide contact information for patient to call hepatology office to schedule establishing visit ?

## 2022-02-05 NOTE — Progress Notes (Signed)
? ? ?  Procedures performed today:   ? ?None. ? ?Independent interpretation of notes and tests performed by another provider:  ? ?None. ? ?Brief History, Exam, Impression, and Recommendations:   ? ?BP (!) 183/137   Pulse (!) 102   Ht 5\' 7"  (1.702 m)   Wt 134 lb 3.2 oz (60.9 kg)   SpO2 100%   BMI 21.02 kg/m?  ? ?Left hip pain ?Has been going on for about 4 weeks, has worsened over the past few days. ?No inciting event recalled. Has had some stiffness with it. Mostly located over the anterior hip. No specific aggravating factors.  Has tried OTC medications, prescribed pain medications without significant relief.  Denies any prior left hip issues. ?On exam, no significant tenderness to palpation over lateral or anterior hip.  Mild pain with resisted flexion, mild pain with abduction and adduction.  Mild pain with FADIR, positive FABER.  Pain with logroll. ?Given current symptoms, will proceed with x-ray imaging to further assess for intra-articular pathology ?Can continue with conservative measures to help with pain control ?Further recommendations pending results of imaging ? ?Transaminitis ?Patient with prior laboratory findings with evidence of impaired liver function, elevated liver enzymes.  He has also had recent ultrasound pleated which was ordered through his hematologist which showed findings within the liver that were suggestive of hepatic steatosis or possible other chronic liver disease.  He has previously been referred to GI/hepatology, however has yet to establish care.  Most recently, his hematologist did provide another referral for patient to establish, he has not done so as of yet.  Prior referral was to establish with hepatologist at [facility]. ?Discussed again today with patient importance of establishing with hepatologist and need for further evaluation and recommendations by specialist.  Patient voiced understanding and indicates that he will reach out to schedule establishing visit.  Advised that  if he has difficulty with finding contact information, to reach out to his hematologist office as they placed referral and should be able to provide contact information for patient to call hepatology office to schedule establishing visit ? ?We will plan for follow-up as needed, next steps to be discussed once imaging completed ? ? ?___________________________________________ ?Salih Williamson de [place], MD, ABFM, CAQSM ?Primary Care and Sports Medicine ?[place] ?

## 2022-02-05 NOTE — Patient Instructions (Signed)
?  Medication Instructions:  ?Your physician recommends that you continue on your current medications as directed. Please refer to the Current Medication list given to you today. ?--If you need a refill on any your medications before your next appointment, please call your pharmacy first. If no refills are authorized on file call the office.-- ?Lab Work: ?Your physician has recommended that you have lab work today: No ?If you have labs (blood work) drawn today and your tests are completely normal, you will receive your results via MyChart message OR a phone call from our staff.  ?Please ensure you check your voicemail in the event that you authorized detailed messages to be left on a delegated number. If you have any lab test that is abnormal or we need to change your treatment, we will call you to review the results. ? ?Referrals/Procedures/Imaging: ?X-ray ? ?Follow-Up: ?Your next appointment:   ?Your physician recommends that you schedule a follow-up appointment prn (as needed) with Dr. de Peru ? ?You will receive a text message or e-mail with a link to a survey about your care and experience with [place] today! We would greatly appreciate your feedback!  ? ?Thanks for letting us be apart of your health journey!!  ?Primary Care and Sports Medicine  ? ?Dr. Marcy Salvo de [place]  ? ?We encourage you to activate your patient portal called "MyChart".  Sign up information is provided on this After Visit Summary.  MyChart is used to connect with patients for Virtual Visits (Telemedicine).  Patients are able to view lab/test results, encounter notes, upcoming appointments, etc.  Non-urgent messages can be sent to your provider as well. To learn more about what you can do with MyChart, please visit --  [email or web address].    ?

## 2022-02-07 ENCOUNTER — Encounter (HOSPITAL_BASED_OUTPATIENT_CLINIC_OR_DEPARTMENT_OTHER): Payer: Self-pay

## 2022-02-12 ENCOUNTER — Other Ambulatory Visit (HOSPITAL_BASED_OUTPATIENT_CLINIC_OR_DEPARTMENT_OTHER): Payer: Self-pay | Admitting: Family Medicine

## 2022-02-12 ENCOUNTER — Ambulatory Visit
Admission: RE | Admit: 2022-02-12 | Discharge: 2022-02-12 | Disposition: A | Discharge status: Home / Self Care | Payer: 59 | Source: Ambulatory Visit | Attending: Family Medicine | Admitting: Family Medicine

## 2022-02-12 DIAGNOSIS — R631 Polydipsia: Secondary | ICD-10-CM | POA: Diagnosis not present

## 2022-02-12 DIAGNOSIS — M25552 Pain in left hip: Secondary | ICD-10-CM

## 2022-02-12 DIAGNOSIS — D693 Immune thrombocytopenic purpura: Secondary | ICD-10-CM | POA: Diagnosis not present

## 2022-02-12 DIAGNOSIS — F54 Psychological and behavioral factors associated with disorders or diseases classified elsewhere: Secondary | ICD-10-CM | POA: Diagnosis not present

## 2022-02-25 ENCOUNTER — Ambulatory Visit (HOSPITAL_BASED_OUTPATIENT_CLINIC_OR_DEPARTMENT_OTHER): Payer: 59 | Admitting: Family Medicine

## 2022-02-25 ENCOUNTER — Encounter (HOSPITAL_BASED_OUTPATIENT_CLINIC_OR_DEPARTMENT_OTHER): Payer: Self-pay | Admitting: Family Medicine

## 2022-02-25 DIAGNOSIS — R748 Abnormal levels of other serum enzymes: Secondary | ICD-10-CM | POA: Diagnosis not present

## 2022-02-25 DIAGNOSIS — D696 Thrombocytopenia, unspecified: Secondary | ICD-10-CM | POA: Diagnosis not present

## 2022-02-25 DIAGNOSIS — R932 Abnormal findings on diagnostic imaging of liver and biliary tract: Secondary | ICD-10-CM | POA: Diagnosis not present

## 2022-02-26 ENCOUNTER — Ambulatory Visit (HOSPITAL_BASED_OUTPATIENT_CLINIC_OR_DEPARTMENT_OTHER): Payer: 59 | Admitting: Family Medicine

## 2022-03-13 DIAGNOSIS — K760 Fatty (change of) liver, not elsewhere classified: Secondary | ICD-10-CM | POA: Diagnosis not present

## 2022-03-13 DIAGNOSIS — I10 Essential (primary) hypertension: Secondary | ICD-10-CM | POA: Diagnosis not present

## 2022-03-14 ENCOUNTER — Ambulatory Visit: Payer: BLUE CROSS/BLUE SHIELD | Admitting: Nurse Practitioner

## 2022-03-21 ENCOUNTER — Emergency Department (HOSPITAL_BASED_OUTPATIENT_CLINIC_OR_DEPARTMENT_OTHER)
Admission: EM | Admit: 2022-03-21 | Discharge: 2022-03-21 | Disposition: A | Discharge status: Home / Self Care | Payer: 59 | Attending: Emergency Medicine | Admitting: Emergency Medicine

## 2022-03-21 ENCOUNTER — Other Ambulatory Visit: Payer: Self-pay

## 2022-03-21 ENCOUNTER — Encounter (HOSPITAL_BASED_OUTPATIENT_CLINIC_OR_DEPARTMENT_OTHER): Payer: Self-pay

## 2022-03-21 DIAGNOSIS — D72819 Decreased white blood cell count, unspecified: Secondary | ICD-10-CM | POA: Insufficient documentation

## 2022-03-21 DIAGNOSIS — Y908 Blood alcohol level of 240 mg/100 ml or more: Secondary | ICD-10-CM | POA: Diagnosis not present

## 2022-03-21 DIAGNOSIS — D72829 Elevated white blood cell count, unspecified: Secondary | ICD-10-CM | POA: Diagnosis not present

## 2022-03-21 DIAGNOSIS — F32A Depression, unspecified: Secondary | ICD-10-CM | POA: Diagnosis not present

## 2022-03-21 DIAGNOSIS — Z20822 Contact with and (suspected) exposure to covid-19: Secondary | ICD-10-CM | POA: Diagnosis not present

## 2022-03-21 DIAGNOSIS — D696 Thrombocytopenia, unspecified: Secondary | ICD-10-CM | POA: Diagnosis not present

## 2022-03-21 DIAGNOSIS — F1010 Alcohol abuse, uncomplicated: Secondary | ICD-10-CM | POA: Diagnosis not present

## 2022-03-21 LAB — COMPREHENSIVE METABOLIC PANEL
ALT: 55 U/L — ABNORMAL HIGH (ref 0–44)
AST: 299 U/L — ABNORMAL HIGH (ref 15–41)
Albumin: 4.1 g/dL (ref 3.5–5.0)
Alkaline Phosphatase: 161 U/L — ABNORMAL HIGH (ref 38–126)
Anion gap: 8 (ref 5–15)
BUN: <5 mg/dL — ABNORMAL LOW (ref 6–20)
CO2: 30 mmol/L (ref 22–32)
Calcium: 8.9 mg/dL (ref 8.9–10.3)
Chloride: 106 mmol/L (ref 98–111)
Creatinine, Ser: 0.68 mg/dL (ref 0.61–1.24)
GFR, Estimated: >60 mL/min (ref >60)
Glucose, Bld: 104 mg/dL — ABNORMAL HIGH (ref 70–99)
Potassium: 3.1 mmol/L — ABNORMAL LOW (ref 3.5–5.1)
Sodium: 144 mmol/L (ref 135–145)
Total Bilirubin: 1.2 mg/dL (ref 0.3–1.2)
Total Protein: 7.3 g/dL (ref 6.5–8.1)

## 2022-03-21 LAB — ETHANOL: Alcohol, Ethyl (B): 383 mg/dL (ref <10)

## 2022-03-21 LAB — CBC WITH DIFFERENTIAL/PLATELET
Abs Immature Granulocytes: 0 10*3/uL (ref 0.00–0.07)
Basophils Absolute: 0 10*3/uL (ref 0.0–0.1)
Basophils Relative: 0 %
Eosinophils Absolute: 0 10*3/uL (ref 0.0–0.5)
Eosinophils Relative: 0 %
HCT: 38.9 % — ABNORMAL LOW (ref 39.0–52.0)
Hemoglobin: 13.3 g/dL (ref 13.0–17.0)
Lymphocytes Relative: 81 %
Lymphs Abs: 1.5 10*3/uL (ref 0.7–4.0)
MCH: 32.4 pg (ref 26.0–34.0)
MCHC: 34.2 g/dL (ref 30.0–36.0)
MCV: 94.6 fL (ref 80.0–100.0)
Monocytes Absolute: 0.1 10*3/uL (ref 0.1–1.0)
Monocytes Relative: 5 %
Neutro Abs: 0.3 10*3/uL — CL (ref 1.7–7.7)
Neutrophils Relative %: 14 %
Platelets: 26 10*3/uL — CL (ref 150–400)
RBC: 4.11 MIL/uL — ABNORMAL LOW (ref 4.22–5.81)
RDW: 14.6 % (ref 11.5–15.5)
WBC: 1.9 10*3/uL — ABNORMAL LOW (ref 4.0–10.5)
nRBC: 0 % (ref 0.0–0.2)

## 2022-03-21 LAB — RESP PANEL BY RT-PCR (FLU A&B, COVID) ARPGX2
Influenza A by PCR: NEGATIVE
Influenza B by PCR: NEGATIVE
SARS Coronavirus 2 by RT PCR: NEGATIVE

## 2022-03-21 NOTE — ED Notes (Signed)
RT Note: Urine cup in room, pt. unable to provide sample at this time, RN made aware.

## 2022-03-21 NOTE — ED Provider Notes (Signed)
[facility] EMERGENCY DEPT Provider Note   CSN: [id number] Arrival date & time: 03/21/22  0998     History  Chief Complaint  Patient presents with  . Depression    Chase Townsend is a 26 y.o. male.  HPI 26 year old male history of lupus presents today complaining of depression.  He states that he has had intermittent severe sadness.  He feels that this is a side effect of his lupus treatment.  He is also on prednisone.  He has not had any prior mental health visit, hospitalization, or medications prescribed.  He states that he feels that his life is very difficult due to the lupus and that his wife and child might be better off without him.  However, he denies any suicidality on multiple questionings.     Home Medications Prior to Admission medications   Medication Sig Start Date End Date Taking? Authorizing Provider  acetaminophen (TYLENOL) 650 MG CR tablet Take 650 mg by mouth every 8 (eight) hours as needed for pain.    [provider]  Avatrombopag Maleate (DOPTELET) 20 MG TABS Take 40 mg by mouth once daily 10/03/21     carvedilol (COREG) 12.5 MG tablet Take 1 tablet (12.5 mg total) by mouth 2 (two) times daily. 05/06/21   de [place], Raymond J, MD  COVID-19 At Home Antigen Test KIT Use as directed. 07/02/21   Margie Ege, [facility]  hydroxychloroquine (PLAQUENIL) 200 MG tablet Take 200 mg by mouth daily.     [provider]  loratadine (CLARITIN) 10 MG tablet Take 10 mg by mouth daily as needed for allergies.     [provider]  omega-3 fish oil (MAXEPA) 1000 MG CAPS capsule Take 1 capsule by mouth daily.    [provider]  potassium chloride SA (KLOR-CON) 20 MEQ tablet Take 1 tablet (20 mEq total) by mouth 2 (two) times daily. 05/17/21   Fredia Sorrow, MD  predniSONE (DELTASONE) 20 MG tablet Take 2 tablets (40 mg total) by mouth once daily 08/15/21     predniSONE (DELTASONE) 5 MG tablet Take 5 mg by mouth daily with breakfast.      [provider]  sulfamethoxazole-trimethoprim (BACTRIM DS) 800-160 MG tablet Take 1 tablet by mouth 2 (two) times daily. 07/02/21   de [place], Blondell Reveal, MD  traMADol (ULTRAM) 50 MG tablet Take 50 mg by mouth as needed. 01/29/18   [provider]      Allergies    Montelukast, Aspirin, Azathioprine, Ibuprofen, Singulair [montelukast sodium], and Amoxicillin-pot clavulanate    Review of Systems   Review of Systems  Physical Exam Updated Vital Signs BP (!) 155/123   Pulse 81   Temp 97.9 F (36.6 C) (Oral)   Resp 12   Ht 1.702 m (_0 )   Wt 61.2 kg   SpO2 100%   BMI 21.14 kg/m  Physical Exam Vitals reviewed.  Constitutional:      Appearance: Normal appearance.  HENT:     Head: Normocephalic.     Right Ear: External ear normal.     Left Ear: External ear normal.     Nose: Nose normal.     Mouth/Throat:     Pharynx: Oropharynx is clear.  Eyes:     Pupils: Pupils are equal, round, and reactive to light.  Cardiovascular:     Rate and Rhythm: Normal rate.     Pulses: Normal pulses.  Pulmonary:     Effort: Pulmonary effort is normal.  Abdominal:  General: Abdomen is flat.  Musculoskeletal:        General: Normal range of motion.     Cervical back: Normal range of motion.  Skin:    General: Skin is warm.     Capillary Refill: Capillary refill takes less than 2 seconds.  Neurological:     General: No focal deficit present.     Mental Status: He is alert.  Psychiatric:        Attention and Perception: Attention normal.        Mood and Affect: Mood is depressed. Affect is tearful.        Speech: Speech normal.        Behavior: Behavior normal.        Thought Content: Thought content does not include homicidal or suicidal ideation. Thought content does not include suicidal plan.        Cognition and Memory: Cognition normal.        Judgment: Judgment normal.     ED Results / Procedures / Treatments   Labs (all labs ordered are listed, but only  abnormal results are displayed) Labs Reviewed  COMPREHENSIVE METABOLIC PANEL - Abnormal; Notable for the following components:      Result Value   Potassium 3.1 (*)    Glucose, Bld 104 (*)    BUN <5 (*)    AST 299 (*)    ALT 55 (*)    Alkaline Phosphatase 161 (*)    All other components within normal limits  ETHANOL - Abnormal; Notable for the following components:   Alcohol, Ethyl (B) 383 (*)    All other components within normal limits  CBC WITH DIFFERENTIAL/PLATELET - Abnormal; Notable for the following components:   WBC 1.9 (*)    RBC 4.11 (*)    HCT 38.9 (*)    Platelets 26 (*)    Neutro Abs 0.3 (*)    All other components within normal limits  RESP PANEL BY RT-PCR (FLU A&B, COVID) [id number]  RAPID URINE DRUG SCREEN, HOSP PERFORMED    EKG None  Radiology No results found.  Procedures Procedures    Medications Ordered in ED Medications - No data to display  ED Course/ Medical Decision Making/ A&P Clinical Course as of 03/21/22 1225  Fri Mar 21, 2022  1223 CBC reviewed interpreted pancytopenia noted but appears stable from prior episodes [DR]  1223 Comprehensive metabolic panel(!) C-Met reviewed interpreted elevated liver enzymes noted with patient with known history of same [DR]  1223 Ethanol(!!) Alcohol level elevated at 383 [DR]    Clinical Course User Index [DR] Pattricia Boss, MD                           Medical Decision Making 26 year old male with lupus presents today with reports of depression.  He is evaluated here with labs. In the interim he states that he is feeling much better and the depression has lessened.  He has no suicidal or homicidal ideation. Depression plan referral to outpatient resources patient appears stable for discharge With work-up patient's pancytopenia is noted.  This is stable from prior.  Patient states that his platelets are always low in 25,000 and is about average for him.  He has no signs or symptoms of bleeding.  He is  advised regarding follow-up. Alcohol level is significantly elevated at 383.  Patient states that he does he did drink a lot last night.  Clinically, he appears sober.  I  have discussed with him that this level likely represents chronic abuse and this is not helpful for either his depression or his pancytopenia and elevated liver enzymes.  He is encouraged to stop drinking.  He is encouraged to get help and will be given the resource guide.  Amount and/or Complexity of Data Reviewed Labs: ordered.           Final Clinical Impression(s) / ED Diagnoses Final diagnoses:  Depression, unspecified depression type  Thrombocytopenia (HCC)  Leukopenia, unspecified type  Alcohol abuse    Rx / DC Orders ED Discharge Orders     None         Pattricia Boss, MD 03/21/22 1225

## 2022-03-21 NOTE — ED Triage Notes (Signed)
Pt. States he has Lupus and the side effects from that are depression. He states he does not want to harm self or has plan but the thoughts of SI has entered his mind.

## 2022-03-21 NOTE — ED Notes (Signed)
Pt. States he is not suicidal just depressed, has no intention of harming self and does not have a plan.

## 2022-03-21 NOTE — Discharge Instructions (Signed)
Please call for outpatient follow-up for alcohol abuse and depression Return if you are feeling worse at any time Do not drive within 24 hours of drinking alcohol. Please have your blood counts rechecked with your doctor next week Return for any signs of bleeding

## 2022-04-23 ENCOUNTER — Ambulatory Visit (HOSPITAL_COMMUNITY)
Admission: RE | Admit: 2022-04-23 | Discharge: 2022-04-23 | Disposition: A | Discharge status: Home / Self Care | Payer: 59 | Attending: Psychiatry | Admitting: Psychiatry
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
## 2022-04-28 DIAGNOSIS — M3219 Other organ or system involvement in systemic lupus erythematosus: Secondary | ICD-10-CM | POA: Diagnosis not present

## 2022-04-30 DIAGNOSIS — D696 Thrombocytopenia, unspecified: Secondary | ICD-10-CM | POA: Diagnosis not present

## 2022-05-07 DIAGNOSIS — Z79899 Other long term (current) drug therapy: Secondary | ICD-10-CM | POA: Diagnosis not present

## 2022-05-07 DIAGNOSIS — G8929 Other chronic pain: Secondary | ICD-10-CM | POA: Diagnosis not present

## 2022-05-07 DIAGNOSIS — D696 Thrombocytopenia, unspecified: Secondary | ICD-10-CM | POA: Diagnosis not present

## 2022-05-15 DIAGNOSIS — D696 Thrombocytopenia, unspecified: Secondary | ICD-10-CM | POA: Diagnosis not present

## 2022-05-22 ENCOUNTER — Ambulatory Visit: Payer: 59 | Admitting: Nurse Practitioner

## 2022-05-22 ENCOUNTER — Telehealth: Payer: Self-pay | Admitting: Nurse Practitioner

## 2022-05-22 NOTE — Telephone Encounter (Signed)
1st no show, fee waived, letter sent 

## 2022-05-22 NOTE — Telephone Encounter (Signed)
8.17.23 no show letter sent 

## 2022-05-23 DIAGNOSIS — Z79899 Other long term (current) drug therapy: Secondary | ICD-10-CM | POA: Diagnosis not present

## 2022-05-30 DIAGNOSIS — D696 Thrombocytopenia, unspecified: Secondary | ICD-10-CM | POA: Diagnosis not present

## 2022-06-04 DIAGNOSIS — D696 Thrombocytopenia, unspecified: Secondary | ICD-10-CM | POA: Diagnosis not present

## 2022-06-10 ENCOUNTER — Ambulatory Visit: Payer: 59 | Admitting: Nurse Practitioner

## 2022-06-10 ENCOUNTER — Encounter: Payer: Self-pay | Admitting: Nurse Practitioner

## 2022-06-10 VITALS — BP 180/110 | HR 100 | Temp 97.9°F | Ht 67.0 in | Wt 148.8 lb

## 2022-06-10 DIAGNOSIS — Z7952 Long term (current) use of systemic steroids: Secondary | ICD-10-CM | POA: Insufficient documentation

## 2022-06-10 DIAGNOSIS — I10 Essential (primary) hypertension: Secondary | ICD-10-CM

## 2022-06-10 DIAGNOSIS — R739 Hyperglycemia, unspecified: Secondary | ICD-10-CM | POA: Diagnosis not present

## 2022-06-10 LAB — RENAL FUNCTION PANEL
Albumin: 4 g/dL (ref 3.5–5.2)
BUN: 9 mg/dL (ref 6–23)
CO2: 29 mEq/L (ref 19–32)
Calcium: 8.8 mg/dL (ref 8.4–10.5)
Chloride: 102 mEq/L (ref 96–112)
Creatinine, Ser: 0.84 mg/dL (ref 0.40–1.50)
GFR: 118.63 mL/min (ref >60.00)
Glucose, Bld: 90 mg/dL (ref 70–99)
Phosphorus: 4 mg/dL (ref 2.3–4.6)
Potassium: 3.8 mEq/L (ref 3.5–5.1)
Sodium: 142 mEq/L (ref 135–145)

## 2022-06-10 LAB — HEMOGLOBIN A1C: Hgb A1c MFr Bld: 5 % (ref 4.6–6.5)

## 2022-06-10 LAB — TSH: TSH: 1.55 u[IU]/mL (ref 0.35–5.50)

## 2022-06-10 MED ORDER — AMLODIPINE BESYLATE 10 MG PO TABS: 10.0000 mg | ORAL_TABLET | Freq: Every day | ORAL | 5 refills | Status: DC

## 2022-06-10 NOTE — Progress Notes (Signed)
Established Patient Visit  Patient: Chase Townsend   DOB: 24-Jan-1996   26 y.o. Male  MRN: [id number] Visit Date: 06/10/2022  Subjective:    Chief Complaint  Patient presents with   Establish Care    New pt, est care Lupus & HTN  Checks BP daily     HPI Hypertension diagnosed 2022 by hematology Only med used in past coreg. Reports he is compliant with coreg dose and DASH diet Home BP: 170s-180s/100-115. Intermittent HA (chronic and stable per aptient) No snoring, no apnea Chronic use of systemic steriod No FHx of HTN or CAD or CKD. No tobacco use  BP Readings from Last 3 Encounters:  06/10/22 (!) 180/110  03/21/22 (!) 155/123  02/05/22 (!) 183/137   check BMP and TSH Get renal US Maintain coreg dose Add amlodipine 50m F/up in 178monthN:   Lupus diagnosed in 3rd grade Long term use of corticosteriods  Reviewed medical, surgical, and social history today Family History  Problem Relation Age of Onset   Stroke Maternal Grandmother    Cancer Maternal Grandmother        Lung with mets to brain   Diabetes Maternal Grandmother    Hyperlipidemia Maternal Grandfather     Social History   Socioeconomic History   Marital status: Married    Spouse name: Not on file   Number of children: Not on file   Years of education: Not on file   Highest education level: Not on file  Occupational History   Not on file  Tobacco Use   Smoking status: Never   Smokeless tobacco: Never  Vaping Use   Vaping Use: Never used  Substance and Sexual Activity   Alcohol use: Yes    Alcohol/week: 4.0 standard drinks of alcohol    Types: 4 Standard drinks or equivalent per week   Drug use: No   Sexual activity: Yes  Other Topics Concern   Not on file  Social History Narrative   Not on file   Social Determinants of Health   Financial Resource Strain: Not on file  Food Insecurity: Not on file  Transportation Needs: Not on file  Physical Activity: Not on file  Stress:  Not on file  Social Connections: Not on file  Intimate Partner Violence: Not on file    Medications: Outpatient Medications Prior to Visit  Medication Sig   acetaminophen (TYLENOL) 650 MG CR tablet Take 650 mg by mouth every 8 (eight) hours as needed for pain.   carvedilol (COREG) 12.5 MG tablet Take 1 tablet (12.5 mg total) by mouth 2 (two) times daily.   hydroxychloroquine (PLAQUENIL) 200 MG tablet Take 200 mg by mouth daily.    loratadine (CLARITIN) 10 MG tablet Take 10 mg by mouth daily as needed for allergies.    omega-3 fish oil (MAXEPA) 1000 MG CAPS capsule Take 1 capsule by mouth daily.   predniSONE (DELTASONE) 10 MG tablet Take 10 mg by mouth daily with breakfast.   traMADol (ULTRAM) 50 MG tablet Take 50 mg by mouth as needed.   [DISCONTINUED] predniSONE (DELTASONE) 20 MG tablet Take 2 tablets (40 mg total) by mouth once daily   [DISCONTINUED] Avatrombopag Maleate (DOPTELET) 20 MG TABS Take 40 mg by mouth once daily (Patient not taking: Reported on 06/10/2022)   [DISCONTINUED] COVID-19 At Home Antigen Test KIT Use as directed.   [DISCONTINUED] potassium chloride SA (KLOR-CON) 20 MEQ tablet Take 1 tablet (  20 mEq total) by mouth 2 (two) times daily.   [DISCONTINUED] predniSONE (DELTASONE) 5 MG tablet Take 5 mg by mouth daily with breakfast.    [DISCONTINUED] sulfamethoxazole-trimethoprim (BACTRIM DS) 800-160 MG tablet Take 1 tablet by mouth 2 (two) times daily.   No facility-administered medications prior to visit.   Reviewed past medical and social history.   ROS per HPI above  Last CBC Lab Results  Component Value Date   WBC 1.9 (L) 03/21/2022   HGB 13.3 03/21/2022   HCT 38.9 (L) 03/21/2022   MCV 94.6 03/21/2022   MCH 32.4 03/21/2022   RDW 14.6 03/21/2022   PLT 26 (LL) 62/69/4854   Last metabolic panel Lab Results  Component Value Date   GLUCOSE 104 (H) 03/21/2022   NA 144 03/21/2022   K 3.1 (L) 03/21/2022   CL 106 03/21/2022   CO2 30 03/21/2022   BUN <5 (L)  03/21/2022   CREATININE 0.68 03/21/2022   GFRNONAA >60 03/21/2022   CALCIUM 8.9 03/21/2022   PROT 7.3 03/21/2022   ALBUMIN 4.1 03/21/2022   BILITOT 1.2 03/21/2022   ALKPHOS 161 (H) 03/21/2022   AST 299 (H) 03/21/2022   ALT 55 (H) 03/21/2022   ANIONGAP 8 03/21/2022   Last hemoglobin A1c No results found for: "HGBA1C" Last thyroid functions Lab Results  Component Value Date   TSH 0.505 02/01/2022        Objective:  BP (!) 180/110   Pulse 100   Temp 97.9 F (36.6 C) (Temporal)   Ht _0  (1.702 m)   Wt 148 lb 12.8 oz (67.5 kg)   SpO2 100%   BMI 23.31 kg/m      Physical Exam Cardiovascular:     Rate and Rhythm: Normal rate and regular rhythm.     Pulses: Normal pulses.     Heart sounds: Normal heart sounds.  Pulmonary:     Effort: Pulmonary effort is normal.     Breath sounds: Normal breath sounds.  Musculoskeletal:     Right lower leg: No edema.     Left lower leg: No edema.  Neurological:     Mental Status: He is alert.     No results found for any visits on 06/10/22.    Assessment & Plan:    Problem List Items Addressed This Visit       Cardiovascular and Mediastinum   Hypertension - Primary    diagnosed 2022 by hematology Only med used in past coreg. Reports he is compliant with coreg dose and DASH diet Home BP: 170s-180s/100-115. Intermittent HA (chronic and stable per aptient) No snoring, no apnea Chronic use of systemic steriod No FHx of HTN or CAD or CKD. No tobacco use  BP Readings from Last 3 Encounters:  06/10/22 (!) 180/110  03/21/22 (!) 155/123  02/05/22 (!) 183/137  check BMP and TSH Get renal US Maintain coreg dose Add amlodipine 82m F/up in 151month    Relevant Medications   amLODipine (NORVASC) 10 MG tablet   Other Relevant Orders   Renal Function Panel   TSH     Other   Current chronic use of systemic steroids   Hyperglycemia   Relevant Orders   Hemoglobin A1c   Return in about 4 weeks (around 07/08/2022) for  HTN.     ChWilfred LacyNP

## 2022-06-10 NOTE — Assessment & Plan Note (Addendum)
diagnosed 2022 by hematology Only med used in past coreg. Reports he is compliant with coreg dose and DASH diet Home BP: 170s-180s/100-115. Intermittent HA (chronic and stable per aptient) No snoring, no apnea Chronic use of systemic steriod No FHx of HTN or CAD or CKD. No tobacco use  BP Readings from Last 3 Encounters:  06/10/22 (!) 180/110  03/21/22 (!) 155/123  02/05/22 (!) 183/137   check BMP and TSH Get renal US Maintain coreg dose Add amlodipine 10mg  F/up in 28month

## 2022-06-10 NOTE — Patient Instructions (Signed)
Thank you for choosing [facility]  Go to lab for blood draw  Start amlodipine 10mg  Continue coreg at current dose You will be contacted to schedule appt for renal .

## 2022-06-11 ENCOUNTER — Other Ambulatory Visit (HOSPITAL_COMMUNITY): Payer: Self-pay

## 2022-06-12 ENCOUNTER — Other Ambulatory Visit (HOSPITAL_COMMUNITY): Payer: Self-pay

## 2022-06-12 MED ORDER — HYDROXYCHLOROQUINE SULFATE 200 MG PO TABS
400.0000 mg | ORAL_TABLET | ORAL | 1 refills | Status: AC
  Filled 2022-06-12: qty 135, 84d supply, fill #0

## 2022-06-13 ENCOUNTER — Other Ambulatory Visit (HOSPITAL_COMMUNITY): Payer: Self-pay

## 2022-06-13 DIAGNOSIS — D696 Thrombocytopenia, unspecified: Secondary | ICD-10-CM | POA: Diagnosis not present

## 2022-06-18 DIAGNOSIS — D696 Thrombocytopenia, unspecified: Secondary | ICD-10-CM | POA: Diagnosis not present

## 2022-06-21 ENCOUNTER — Other Ambulatory Visit (HOSPITAL_COMMUNITY): Payer: Self-pay

## 2022-06-26 DIAGNOSIS — D696 Thrombocytopenia, unspecified: Secondary | ICD-10-CM | POA: Diagnosis not present

## 2022-07-01 ENCOUNTER — Other Ambulatory Visit (HOSPITAL_BASED_OUTPATIENT_CLINIC_OR_DEPARTMENT_OTHER): Payer: Self-pay

## 2022-07-01 MED ORDER — INFLUENZA VAC SPLIT QUAD 0.5 ML IM SUSY
PREFILLED_SYRINGE | INTRAMUSCULAR | 0 refills | Status: DC
  Filled 2022-07-01: qty 0.5, 1d supply, fill #0

## 2022-07-02 DIAGNOSIS — D696 Thrombocytopenia, unspecified: Secondary | ICD-10-CM | POA: Diagnosis not present

## 2022-07-08 ENCOUNTER — Ambulatory Visit: Payer: 59 | Admitting: Nurse Practitioner

## 2022-07-10 DIAGNOSIS — D696 Thrombocytopenia, unspecified: Secondary | ICD-10-CM | POA: Diagnosis not present

## 2022-07-11 ENCOUNTER — Encounter: Payer: Self-pay | Admitting: Nurse Practitioner

## 2022-07-17 ENCOUNTER — Telehealth: Payer: Self-pay | Admitting: Nurse Practitioner

## 2022-07-17 ENCOUNTER — Ambulatory Visit: Payer: 59 | Admitting: Nurse Practitioner

## 2022-07-17 NOTE — Telephone Encounter (Signed)
Pt was a no show 10/12 for an OV with [facility]. This is his second, letter has been sent

## 2022-07-18 ENCOUNTER — Ambulatory Visit: Payer: 59 | Admitting: Family Medicine

## 2022-07-21 DIAGNOSIS — D696 Thrombocytopenia, unspecified: Secondary | ICD-10-CM | POA: Diagnosis not present

## 2022-07-22 ENCOUNTER — Other Ambulatory Visit (HOSPITAL_BASED_OUTPATIENT_CLINIC_OR_DEPARTMENT_OTHER): Payer: Self-pay

## 2022-07-22 MED ORDER — COMIRNATY 30 MCG/0.3ML IM SUSY
PREFILLED_SYRINGE | INTRAMUSCULAR | 0 refills | Status: DC
  Filled 2022-07-22: qty 0.3, 1d supply, fill #0

## 2022-07-23 ENCOUNTER — Encounter: Payer: Self-pay | Admitting: Nurse Practitioner

## 2022-07-23 ENCOUNTER — Other Ambulatory Visit (HOSPITAL_BASED_OUTPATIENT_CLINIC_OR_DEPARTMENT_OTHER): Payer: Self-pay

## 2022-07-23 NOTE — Telephone Encounter (Signed)
05/22/2022 no show, 07/17/2022 no show  2nd no show, fee generated, final warning letter sent via mail and mychart

## 2022-07-24 IMAGING — US US ABDOMEN LIMITED
1 series · 14 of 25 positions shown · non-contrast
Comparison: None.

CLINICAL DATA: Transaminitis

EXAM:
ULTRASOUND ABDOMEN LIMITED RIGHT UPPER QUADRANT

[Series 1: us abdomen limited ruq (liver/gb) · 14 of 39 slices shown]
[im 1/39]
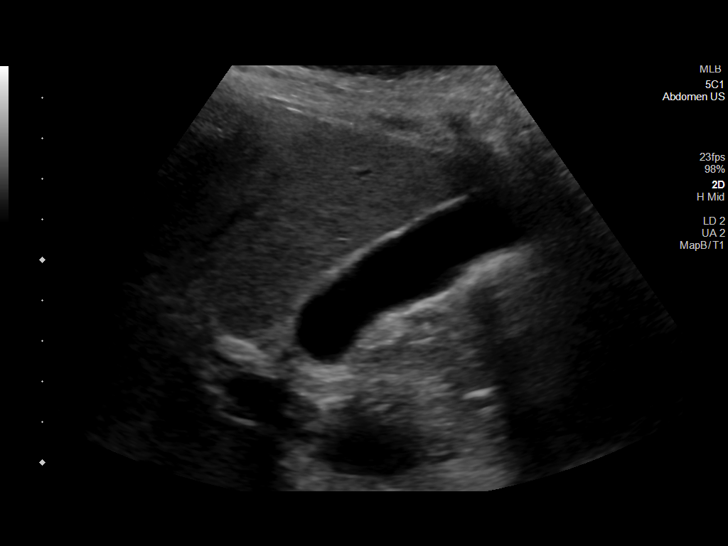
[im 4/39]
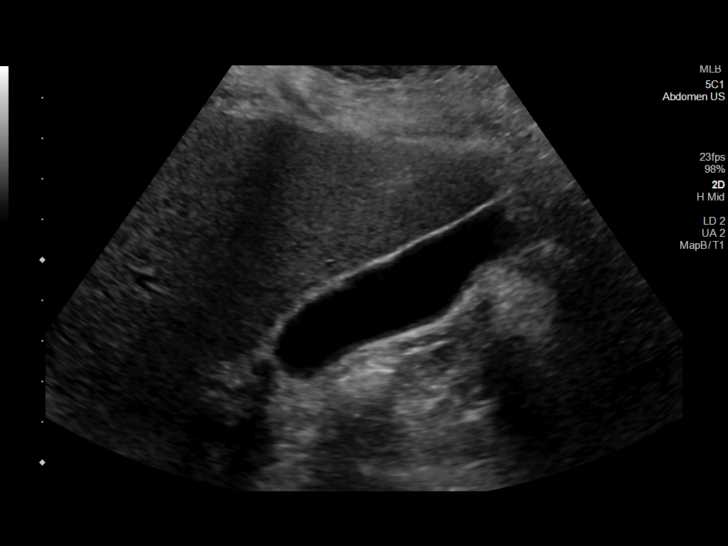
[im 7/39]
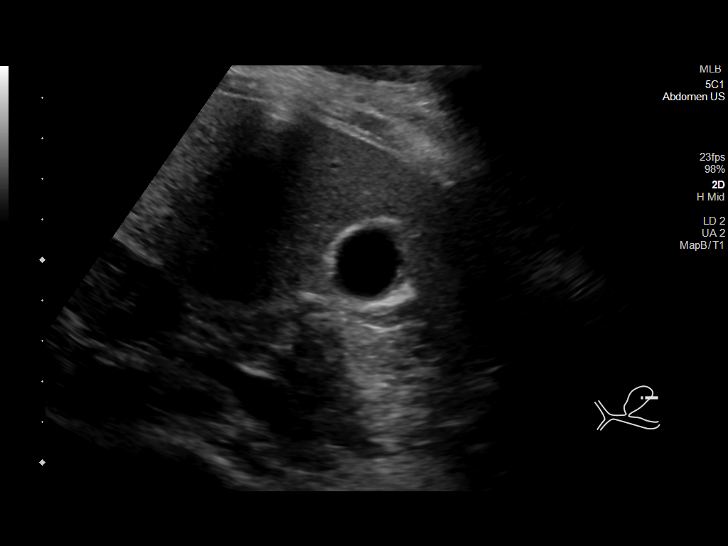
[im 10/39]
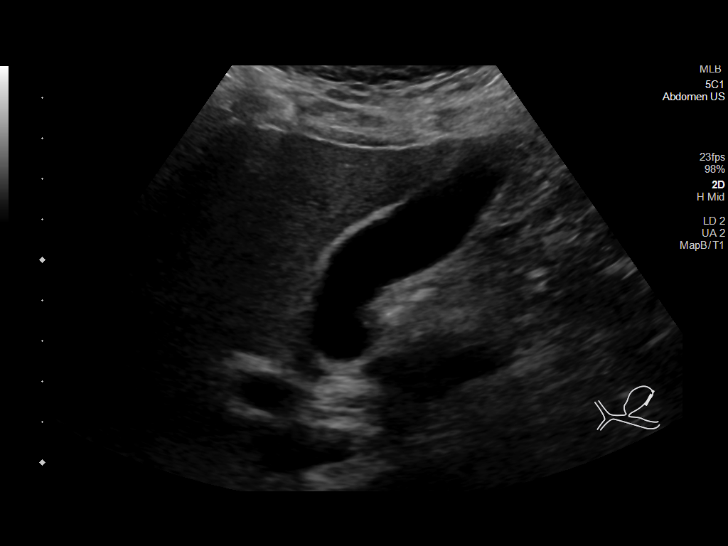
[im 13/39]
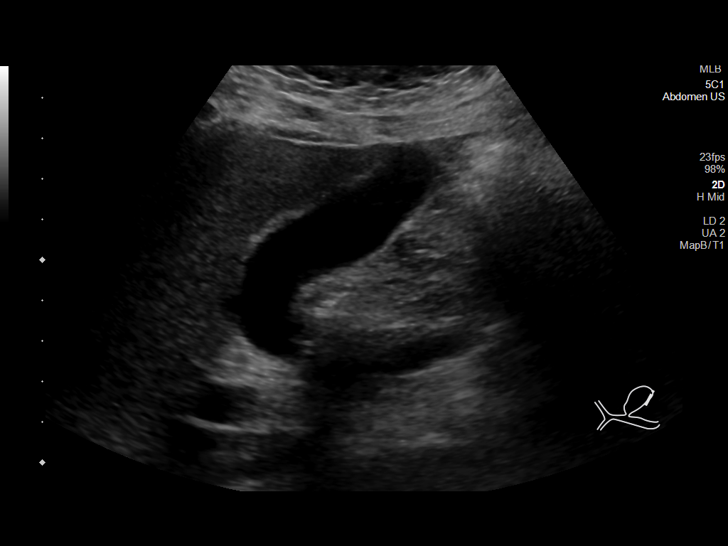
[im 15/39]
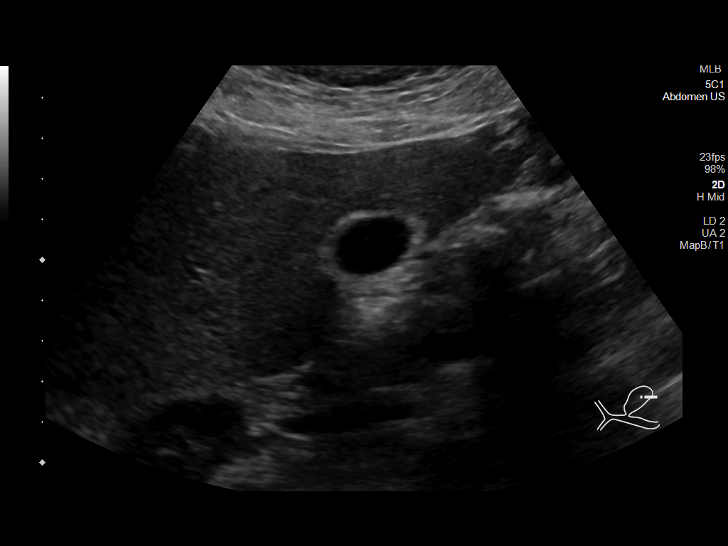
[im 18/39]
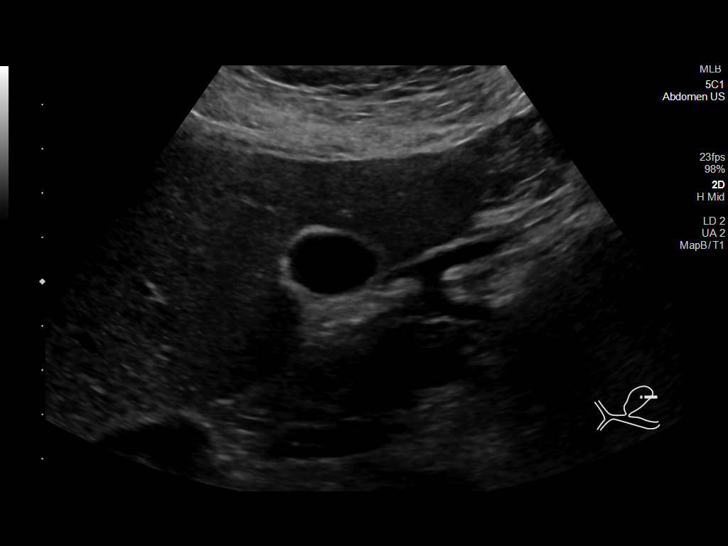
[im 21/39]
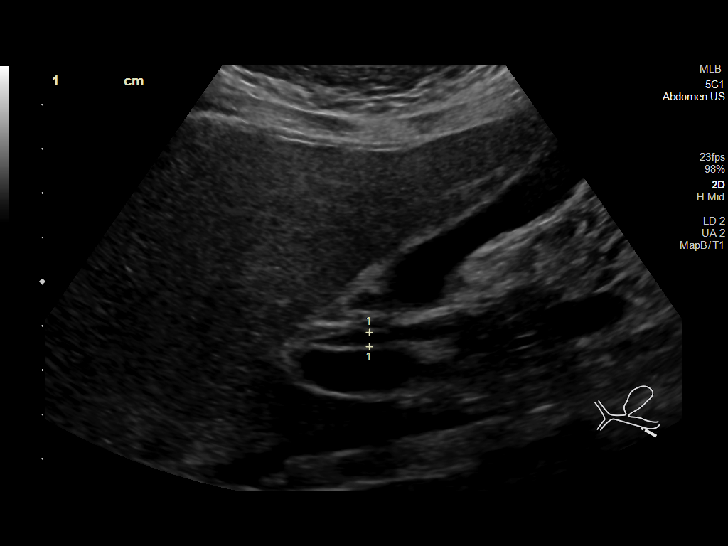
[im 24/39]
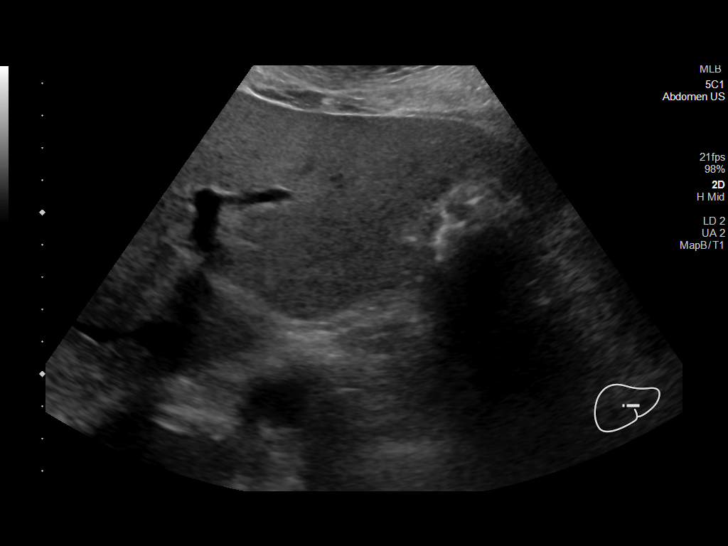
[im 26/39]
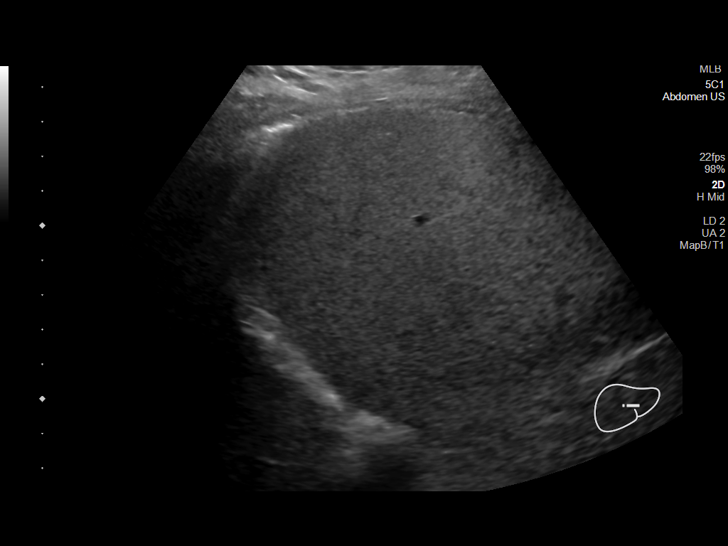
[im 29/39]
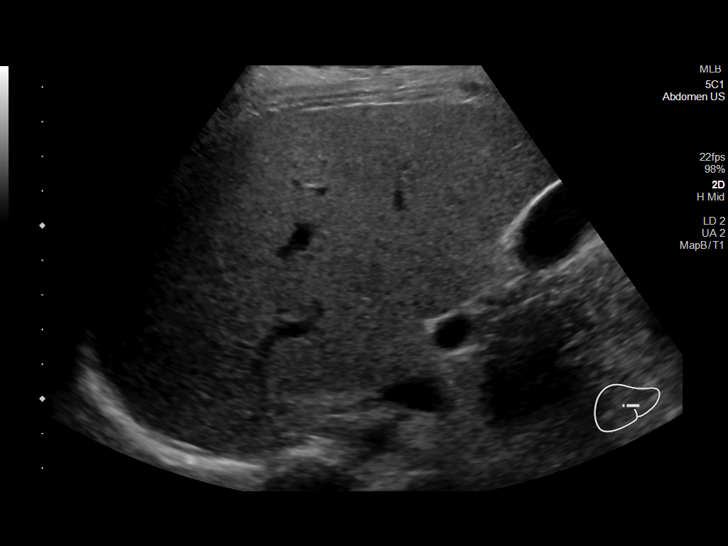
[im 32/39]
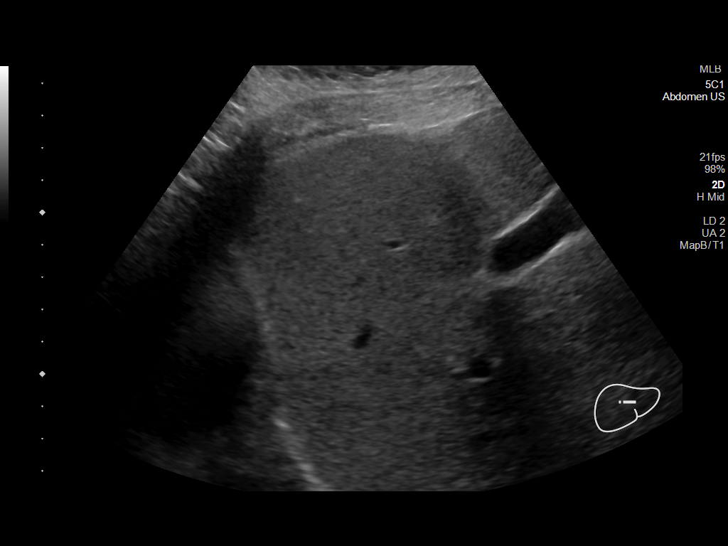
[im 35/39]
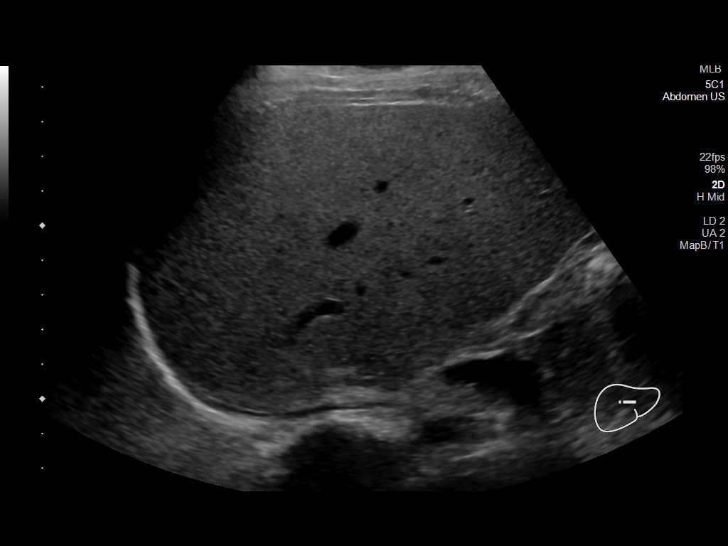
[im 39/39]
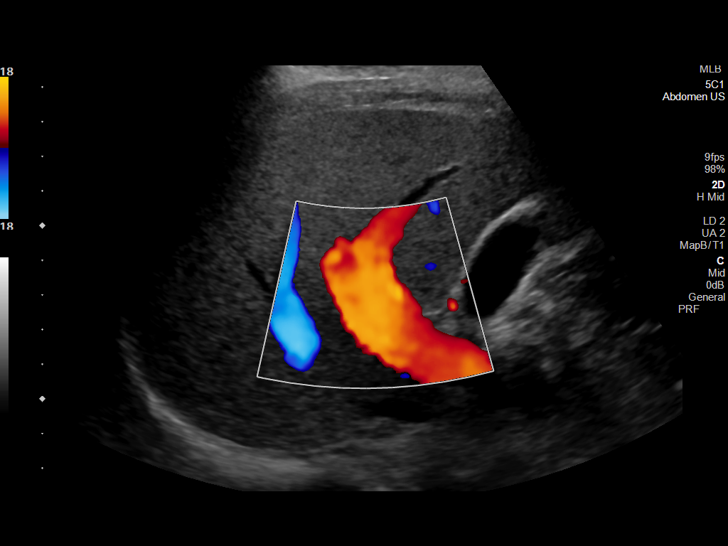

[14 of 25 positions shown; findings below may reference images not displayed]

FINDINGS: Gallbladder:

No gallstones or wall thickening visualized. No sonographic Murphy
sign noted by sonographer.

Common bile duct:

Diameter: 3 mm

Liver:

Coarse, increased echogenicity of the liver parenchyma with no focal
mass identified. Portal vein is patent on color Doppler imaging with
normal direction of blood flow towards the liver.

Other: None.
IMPRESSION: Coarse, increased echogenicity of the liver parenchyma which may
represent hepatic steatosis and/or other hepatocellular disease.

## 2022-07-27 IMAGING — DX DG CHEST 1V PORT
1 series · 1 of 1 positions shown · non-contrast
Comparison: 04/29/2021

CLINICAL DATA: Diffuse pain. Rule out infection. History of lupus.
Fatigue, nausea and weakness.

EXAM:
PORTABLE CHEST 1 VIEW

[chest ap]
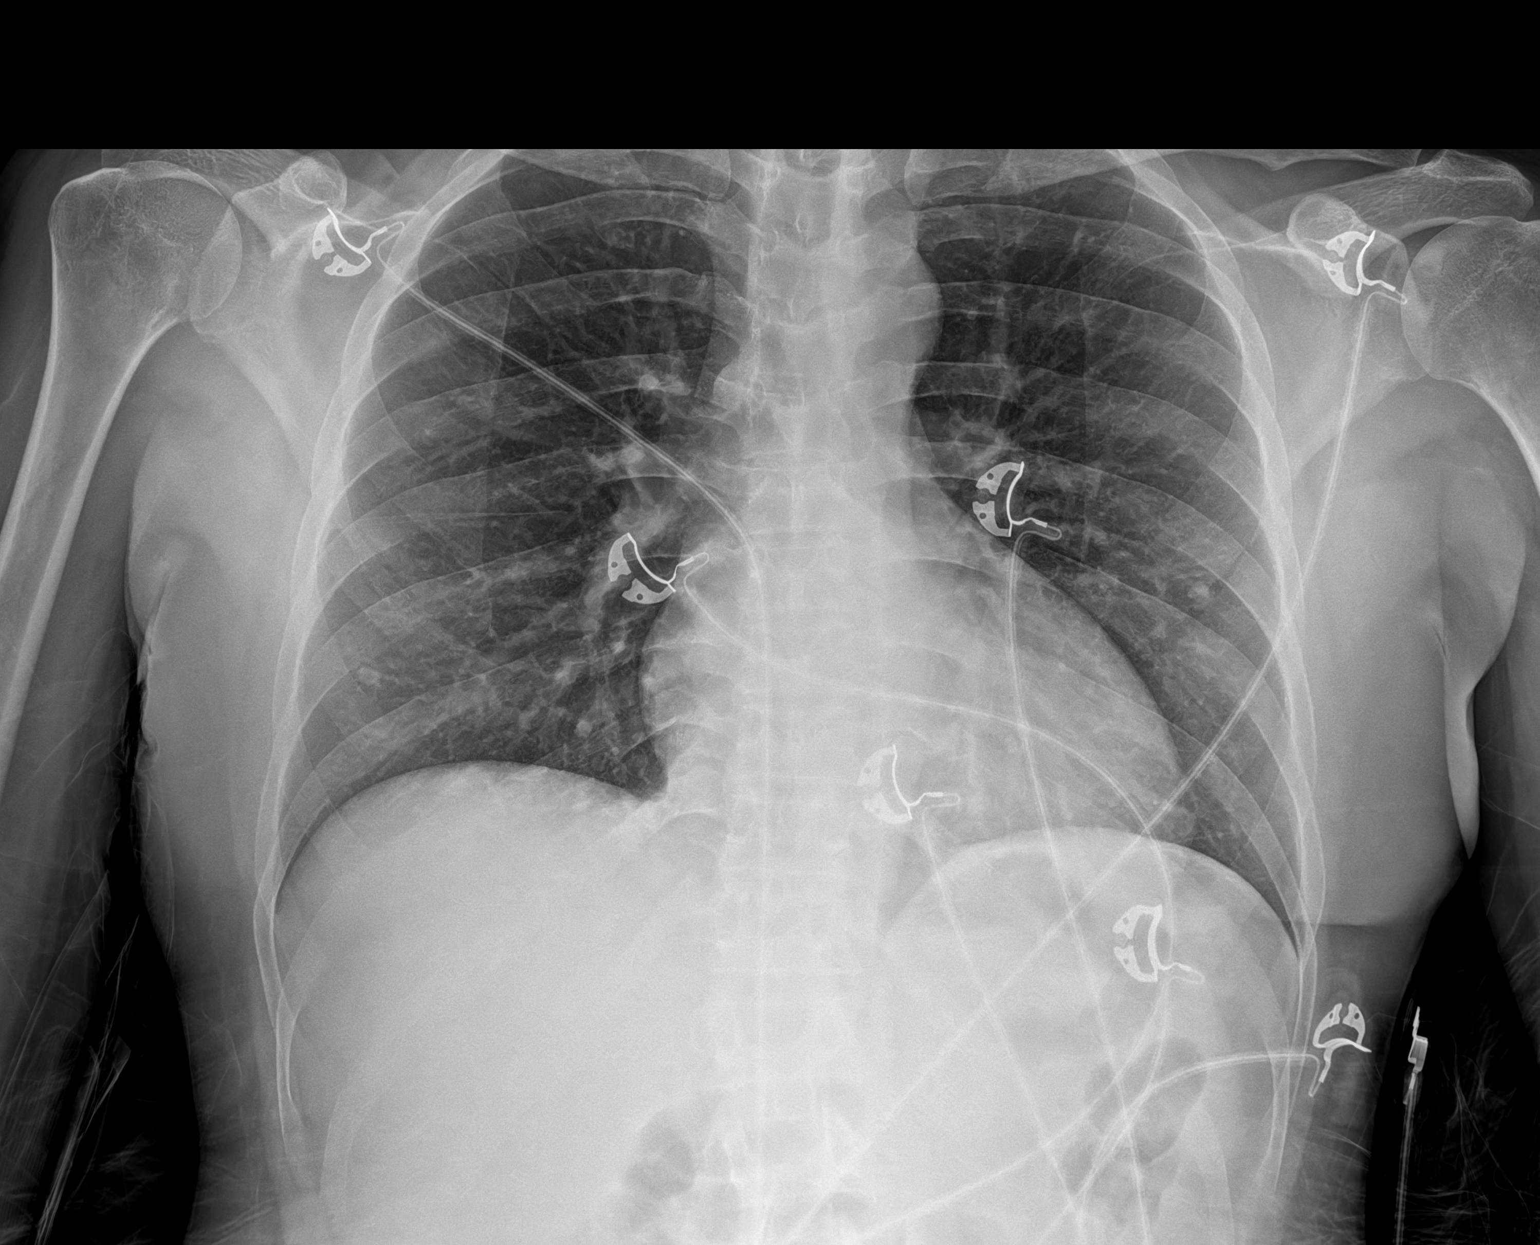

[1 of 1 positions shown; findings below may reference images not displayed]

FINDINGS: Heart size and mediastinal contours are unremarkable. Calcified
granulomas identified within the left midlung and right lower lung.
No pleural effusion or edema. No signs of airspace consolidation.
Osseous structures appear intact.
IMPRESSION: No acute cardiopulmonary abnormalities.

## 2022-07-29 ENCOUNTER — Other Ambulatory Visit (HOSPITAL_BASED_OUTPATIENT_CLINIC_OR_DEPARTMENT_OTHER): Payer: Self-pay

## 2022-08-06 DIAGNOSIS — D696 Thrombocytopenia, unspecified: Secondary | ICD-10-CM | POA: Diagnosis not present

## 2022-08-07 ENCOUNTER — Encounter: Payer: Self-pay | Admitting: Nurse Practitioner

## 2022-08-07 ENCOUNTER — Ambulatory Visit: Payer: 59 | Admitting: Nurse Practitioner

## 2022-08-07 VITALS — BP 140/90 | HR 76 | Temp 97.1°F | Ht 67.0 in | Wt 149.0 lb

## 2022-08-07 DIAGNOSIS — F1010 Alcohol abuse, uncomplicated: Secondary | ICD-10-CM | POA: Diagnosis not present

## 2022-08-07 DIAGNOSIS — F331 Major depressive disorder, recurrent, moderate: Secondary | ICD-10-CM | POA: Diagnosis not present

## 2022-08-07 DIAGNOSIS — H00014 Hordeolum externum left upper eyelid: Secondary | ICD-10-CM

## 2022-08-07 DIAGNOSIS — I10 Essential (primary) hypertension: Secondary | ICD-10-CM | POA: Diagnosis not present

## 2022-08-07 IMAGING — CR DG HIP (WITH OR WITHOUT PELVIS) 2-3V*L*
2 series · 2 of 2 positions shown · non-contrast
Comparison: None Available.

CLINICAL DATA: Left hip pain, no injury.

EXAM:
DG HIP (WITH OR WITHOUT PELVIS) 2-3V LEFT

[t hip ap left]
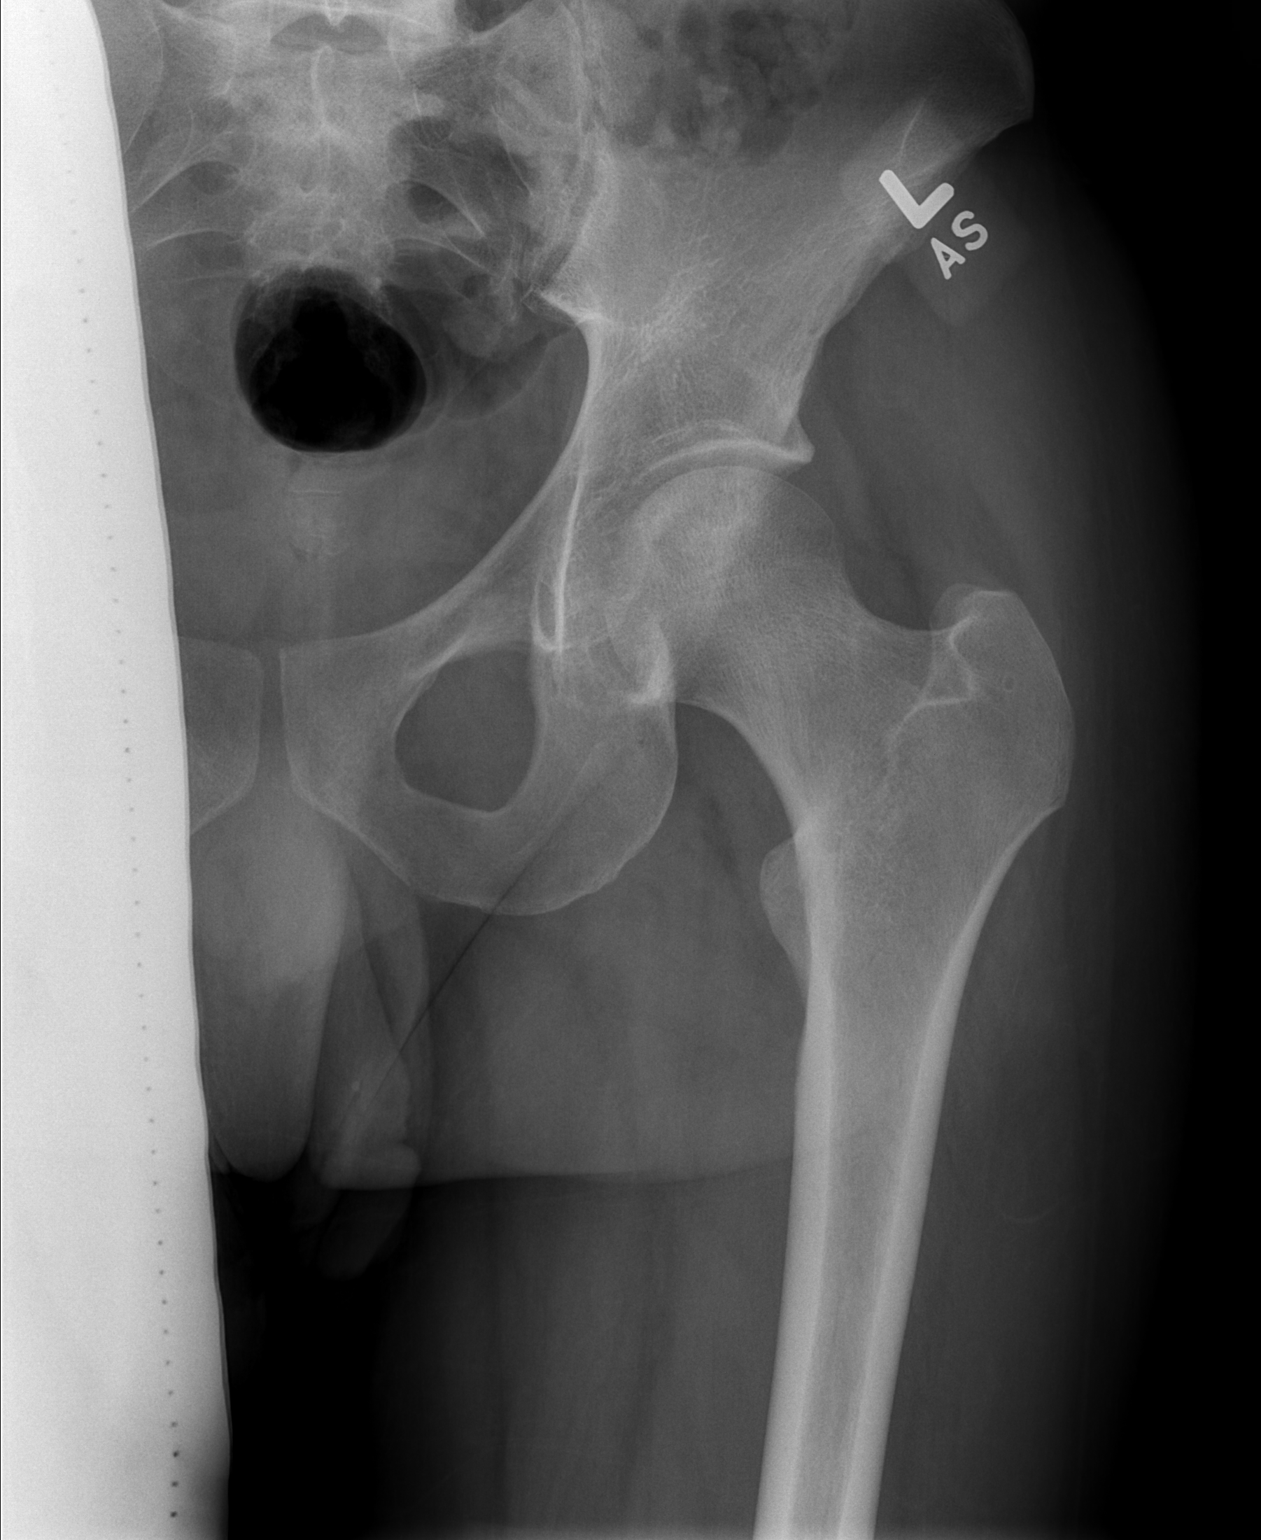

[t hip frog leg left]
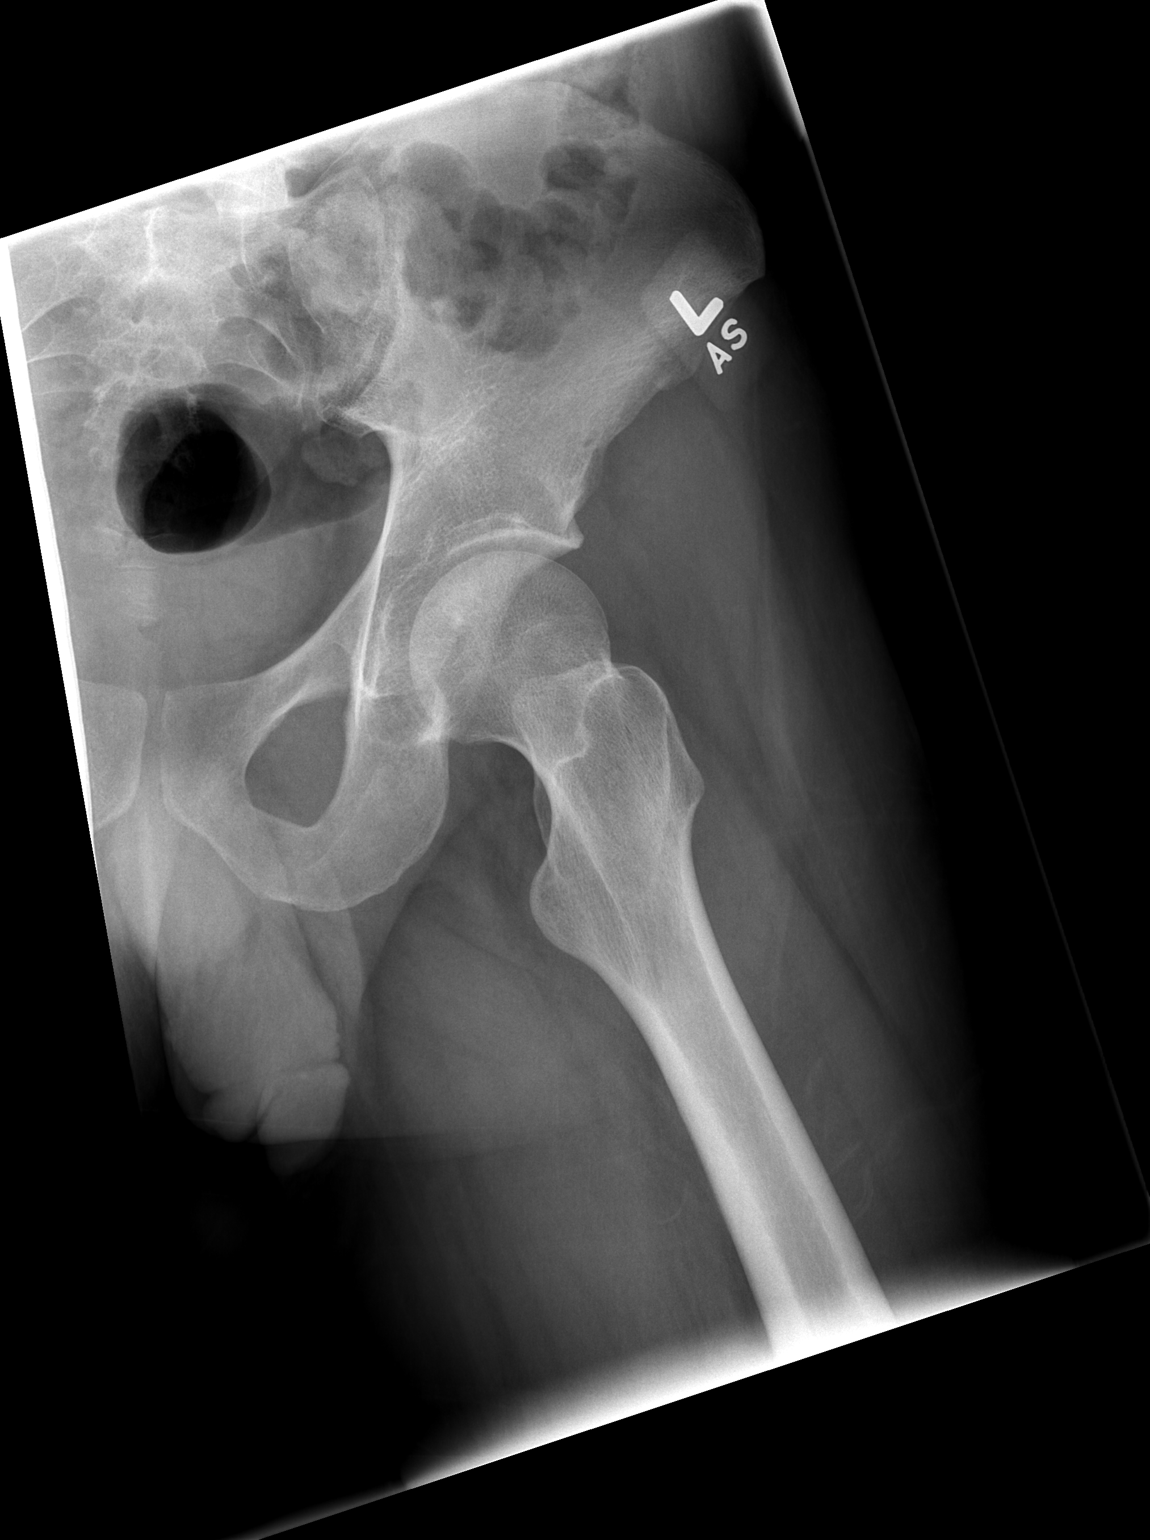

[2 of 2 positions shown; findings below may reference images not displayed]

FINDINGS: There is no evidence of hip fracture or dislocation. There is no
evidence of arthropathy or other focal bone abnormality.
IMPRESSION: Negative.

## 2022-08-07 MED ORDER — DOXYCYCLINE HYCLATE 100 MG PO TABS: 100.0000 mg | ORAL_TABLET | Freq: Two times a day (BID) | ORAL | 0 refills | Status: AC

## 2022-08-07 MED ORDER — CARVEDILOL 12.5 MG PO TABS: 12.5000 mg | ORAL_TABLET | Freq: Two times a day (BID) | ORAL | 3 refills | Status: DC

## 2022-08-07 MED ORDER — FLUOXETINE HCL 10 MG PO CAPS: 10.0000 mg | ORAL_CAPSULE | Freq: Every day | ORAL | 5 refills | Status: DC

## 2022-08-07 MED ORDER — AMLODIPINE BESYLATE 10 MG PO TABS
10.0000 mg | ORAL_TABLET | Freq: Every day | ORAL | 3 refills | Status: DC
Start: 2022-08-07 — End: 2023-01-06

## 2022-08-07 NOTE — Assessment & Plan Note (Signed)
BP at home 120/80s-90s No adverse effect with coreg and amlodipine BP Readings from Last 3 Encounters:  08/07/22 (!) 140/90  06/10/22 (!) 180/110  03/21/22 (!) 155/123    Maintain med dose Advised to maintain DASH diet and bring BP machine to next appt. F/up in 49month

## 2022-08-07 NOTE — Assessment & Plan Note (Signed)
Chronic, waxing and waning, no hx of Suicide attempt or IVC. Denies any hallucinations. Has thoughts about death but has no intention or plan to hurt himself or anyone else. No previous medication or counseling. Current ETOH abuse. Denies any illicit drug use.  Agreed to start fluoxetine 10mg  daily and referral to psychology. Advised to call 911if symptoms worsen F/up in 25month

## 2022-08-07 NOTE — Progress Notes (Signed)
Established Patient Visit  Patient: Chase Townsend   DOB: 1996/07/19   26 y.o. Male  MRN: [id number] Visit Date: 08/07/2022  Subjective:    Chief Complaint  Patient presents with   Office Visit    HTN  Checks BP daily  Says meds have been working  Stye on left eye    Eye Problem  The left eye is affected. This is a new problem. The current episode started 1 to 4 weeks ago. The problem occurs constantly. The problem has been unchanged. There was no injury mechanism. The pain is mild. There is No known exposure to pink eye. He Does not wear contacts. Associated symptoms include eye redness. Pertinent negatives include no blurred vision, double vision, fever, foreign body sensation, itching, photophobia or recent URI. Treatments tried: warm compress.   Major depressive disorder, single episode, moderate (HCC) Chronic, waxing and waning, no hx of Suicide attempt or IVC. Denies any hallucinations. Has thoughts about death but has no intention or plan to hurt himself or anyone else. No previous medication or counseling. Current ETOH abuse. Denies any illicit drug use.  Agreed to start fluoxetine 10mg  daily and referral to psychology. Advised to call 911if symptoms worsen F/up in 34month  Alcohol abuse Drinks 4shots of liquor 3-4x/week, onset in college, use to cope with depressive emotions. Reports withdrawal without ETOH-tremor and irritability.  Advised about risk of seizure with rapid ETOH cessation. Advised about need for inpatient rehab and close supervision if he wants to quit immediately. Advised to decreased ETOH consumption to 1shot daily  Hypertension BP at home 120/80s-90s No adverse effect with coreg and amlodipine BP Readings from Last 3 Encounters:  08/07/22 (!) 140/90  06/10/22 (!) 180/110  03/21/22 (!) 155/123    Maintain med dose Advised to maintain DASH diet and bring BP machine to next appt. F/up in 56month     08/07/2022   11:23 AM 02/05/2022     2:06 PM  Depression screen PHQ 2/9  Decreased Interest 1 0  Down, Depressed, Hopeless 1 0  PHQ - 2 Score 2 0  Altered sleeping 1   Tired, decreased energy 1   Change in appetite 1   Feeling bad or failure about yourself  2   Trouble concentrating 0   Moving slowly or fidgety/restless 0   Suicidal thoughts 1   PHQ-9 Score 8   Difficult doing work/chores Somewhat difficult        08/07/2022   11:23 AM  GAD 7 : Generalized Anxiety Score  Nervous, Anxious, on Edge 2  Control/stop worrying 2  Worry too much - different things 2  Trouble relaxing 2  Restless 1  Easily annoyed or irritable 2  Afraid - awful might happen 3  Total GAD 7 Score 14  Anxiety Difficulty Somewhat difficult   Reviewed medical, surgical, and social history today  Medications: Outpatient Medications Prior to Visit  Medication Sig   acetaminophen (TYLENOL) 650 MG CR tablet Take 650 mg by mouth every 8 (eight) hours as needed for pain.   hydroxychloroquine (PLAQUENIL) 200 MG tablet Take 1 tablet by mouth every other day alternating with 2 tablets by mouth daily.   loratadine (CLARITIN) 10 MG tablet Take 10 mg by mouth daily as needed for allergies.    omega-3 fish oil (MAXEPA) 1000 MG CAPS capsule Take 1 capsule by mouth daily.   predniSONE (DELTASONE) 10 MG tablet Take 10 mg  by mouth daily with breakfast.   [DISCONTINUED] amLODipine (NORVASC) 10 MG tablet Take 1 tablet (10 mg total) by mouth daily.   [DISCONTINUED] carvedilol (COREG) 12.5 MG tablet Take 1 tablet (12.5 mg total) by mouth 2 (two) times daily.   [DISCONTINUED] traMADol (ULTRAM) 50 MG tablet Take 50 mg by mouth as needed.   [DISCONTINUED] COVID-19 mRNA vaccine 2023-2024 (COMIRNATY) syringe Inject into the muscle. (Patient not taking: Reported on 08/07/2022)   [DISCONTINUED] hydroxychloroquine (PLAQUENIL) 200 MG tablet Take 200 mg by mouth daily.  (Patient not taking: Reported on 08/07/2022)   [DISCONTINUED] influenza vac split quadrivalent PF  (FLUARIX) 0.5 ML injection Inject into the muscle. (Patient not taking: Reported on 08/07/2022)   No facility-administered medications prior to visit.   Reviewed past medical and social history.   ROS per HPI above      Objective:  BP (!) 140/90   Pulse 76   Temp (!) 97.1 F (36.2 C) (Temporal)   Ht 5\' 7"  (1.702 m)   Wt 149 lb (67.6 kg)   SpO2 98%   BMI 23.34 kg/m      Physical Exam Cardiovascular:     Rate and Rhythm: Normal rate.     Pulses: Normal pulses.  Pulmonary:     Effort: Pulmonary effort is normal.  Musculoskeletal:     Right lower leg: No edema.     Left lower leg: No edema.  Neurological:     Mental Status: He is alert and oriented to person, place, and time.  Psychiatric:        Attention and Perception: Attention normal.        Mood and Affect: Affect is blunt.        Speech: Speech normal.        Behavior: Behavior is cooperative.        Thought Content: Thought content normal.        Cognition and Memory: Cognition and memory normal.        Judgment: Judgment normal.     No results found for any visits on 08/07/22.    Assessment & Plan:    Problem List Items Addressed This Visit       Cardiovascular and Mediastinum   Hypertension - Primary    BP at home 120/80s-90s No adverse effect with coreg and amlodipine BP Readings from Last 3 Encounters:  08/07/22 (!) 140/90  06/10/22 (!) 180/110  03/21/22 (!) 155/123    Maintain med dose Advised to maintain DASH diet and bring BP machine to next appt. F/up in 32month      Relevant Medications   amLODipine (NORVASC) 10 MG tablet   carvedilol (COREG) 12.5 MG tablet     Other   Alcohol abuse    Drinks 4shots of liquor 3-4x/week, onset in college, use to cope with depressive emotions. Reports withdrawal without ETOH-tremor and irritability.  Advised about risk of seizure with rapid ETOH cessation. Advised about need for inpatient rehab and close supervision if he wants to quit  immediately. Advised to decreased ETOH consumption to 1shot daily      Relevant Orders   Ambulatory referral to Psychology   Major depressive disorder, single episode, moderate (HCC)    Chronic, waxing and waning, no hx of Suicide attempt or IVC. Denies any hallucinations. Has thoughts about death but has no intention or plan to hurt himself or anyone else. No previous medication or counseling. Current ETOH abuse. Denies any illicit drug use.  Agreed to start fluoxetine 10mg   daily and referral to psychology. Advised to call 911if symptoms worsen F/up in 43month      Relevant Medications   FLUoxetine (PROZAC) 10 MG capsule   Other Visit Diagnoses     Hordeolum externum left upper eyelid       Relevant Medications   doxycycline (VIBRA-TABS) 100 MG tablet      Return in about 4 weeks (around 09/04/2022) for HTN, depression and anxiety.     Wilfred Lacy, NP

## 2022-08-07 NOTE — Patient Instructions (Signed)
Use doxycycline for eye infection. Continue warm compress 2-3x/day. Start fluoxetine for mood You will be contacted to schedule appt with therapist. Maintain current BP medications Check BP daily in AM, send BP readings via mychart in 2weeks Maintain low sodium diet Minimize alcohol consumption.

## 2022-08-07 NOTE — Assessment & Plan Note (Signed)
Drinks 4shots of liquor 3-4x/week, onset in college, use to cope with depressive emotions. Reports withdrawal without ETOH-tremor and irritability.  Advised about risk of seizure with rapid ETOH cessation. Advised about need for inpatient rehab and close supervision if he wants to quit immediately. Advised to decreased ETOH consumption to 1shot daily

## 2022-08-12 DIAGNOSIS — I10 Essential (primary) hypertension: Secondary | ICD-10-CM | POA: Diagnosis not present

## 2022-08-12 DIAGNOSIS — D693 Immune thrombocytopenic purpura: Secondary | ICD-10-CM | POA: Diagnosis not present

## 2022-08-12 DIAGNOSIS — R110 Nausea: Secondary | ICD-10-CM | POA: Diagnosis not present

## 2022-08-12 DIAGNOSIS — M3219 Other organ or system involvement in systemic lupus erythematosus: Secondary | ICD-10-CM | POA: Diagnosis not present

## 2022-08-12 DIAGNOSIS — D649 Anemia, unspecified: Secondary | ICD-10-CM | POA: Diagnosis not present

## 2022-08-15 ENCOUNTER — Other Ambulatory Visit (HOSPITAL_BASED_OUTPATIENT_CLINIC_OR_DEPARTMENT_OTHER): Payer: Self-pay

## 2022-08-20 DIAGNOSIS — D696 Thrombocytopenia, unspecified: Secondary | ICD-10-CM | POA: Diagnosis not present

## 2022-08-21 ENCOUNTER — Telehealth: Payer: Self-pay

## 2022-08-21 ENCOUNTER — Encounter: Payer: Self-pay | Admitting: Nurse Practitioner

## 2022-08-21 ENCOUNTER — Ambulatory Visit: Payer: 59 | Admitting: Nurse Practitioner

## 2022-08-21 VITALS — BP 130/88 | HR 87 | Temp 97.3°F | Ht 67.0 in | Wt 150.6 lb

## 2022-08-21 DIAGNOSIS — M25462 Effusion, left knee: Secondary | ICD-10-CM | POA: Diagnosis not present

## 2022-08-21 DIAGNOSIS — M79604 Pain in right leg: Secondary | ICD-10-CM | POA: Diagnosis not present

## 2022-08-21 DIAGNOSIS — M25461 Effusion, right knee: Secondary | ICD-10-CM | POA: Diagnosis not present

## 2022-08-21 DIAGNOSIS — E790 Hyperuricemia without signs of inflammatory arthritis and tophaceous disease: Secondary | ICD-10-CM

## 2022-08-21 DIAGNOSIS — M79605 Pain in left leg: Secondary | ICD-10-CM

## 2022-08-21 DIAGNOSIS — E876 Hypokalemia: Secondary | ICD-10-CM

## 2022-08-21 LAB — BASIC METABOLIC PANEL
BUN: 12 mg/dL (ref 6–23)
CO2: 34 mEq/L — ABNORMAL HIGH (ref 19–32)
Calcium: 8.6 mg/dL (ref 8.4–10.5)
Chloride: 102 mEq/L (ref 96–112)
Creatinine, Ser: 0.85 mg/dL (ref 0.40–1.50)
GFR: 118.05 mL/min (ref >60.00)
Glucose, Bld: 93 mg/dL (ref 70–99)
Potassium: 2.8 mEq/L — CL (ref 3.5–5.1)
Sodium: 144 mEq/L (ref 135–145)

## 2022-08-21 LAB — URIC ACID: Uric Acid, Serum: 9.5 mg/dL — ABNORMAL HIGH (ref 4.0–7.8)

## 2022-08-21 LAB — CBC
HCT: 38.3 % — ABNORMAL LOW (ref 39.0–52.0)
Hemoglobin: 13 g/dL (ref 13.0–17.0)
MCHC: 34 g/dL (ref 30.0–36.0)
MCV: 99.4 fl (ref 78.0–100.0)
Platelets: 80 10*3/uL — ABNORMAL LOW (ref 150.0–400.0)
RBC: 3.86 Mil/uL — ABNORMAL LOW (ref 4.22–5.81)
RDW: 19.9 % — ABNORMAL HIGH (ref 11.5–15.5)
WBC: 4.2 10*3/uL (ref 4.0–10.5)

## 2022-08-21 LAB — CK: Total CK: 56 U/L (ref 7–232)

## 2022-08-21 LAB — C-REACTIVE PROTEIN: CRP: <1 mg/dL (ref 0.5–20.0)

## 2022-08-21 NOTE — Telephone Encounter (Signed)
Critical Potassium level at 2.8 Chase Townsend's patient. Please advise.

## 2022-08-21 NOTE — Telephone Encounter (Signed)
Called patient went over labs informed of results and critical potassium level at 2.8 Dr. Doreene Burke recommended for patient to go to ED tonight patient states that he feels better and declines ED tonight states that he is aware of low levels in the past patient states that he will call tomorrow if anything changes.

## 2022-08-21 NOTE — Progress Notes (Signed)
Established Patient Visit  Patient: Chase Townsend   DOB: [birth year]   26 y.o. Male  MRN: [id number] Visit Date: 08/22/2022  Subjective:    Chief Complaint  Patient presents with   Acute Visit    C/o leg & knee pain in both legs x 6 days     Leg Pain  The incident occurred more than 1 week ago. There was no injury mechanism. The pain is present in the left thigh, left knee, right thigh and right knee. The quality of the pain is described as aching. The pain has been Constant since onset. Pertinent negatives include no inability to bear weight, loss of motion, loss of sensation, muscle weakness, numbness or tingling. The symptoms are aggravated by movement and palpation. He has tried acetaminophen (and tramadol) for the symptoms. The treatment provided no relief.  Pain contract with [facility]. Onset of knee and leg pain after car ride to [place] 1week ago. Denies any injury.  Reviewed medical, surgical, and social history today  Medications: Outpatient Medications Prior to Visit  Medication Sig   acetaminophen (TYLENOL) 650 MG CR tablet Take 650 mg by mouth every 8 (eight) hours as needed for pain.   amLODipine (NORVASC) 10 MG tablet Take 1 tablet (10 mg total) by mouth daily.   carvedilol (COREG) 12.5 MG tablet Take 1 tablet (12.5 mg total) by mouth 2 (two) times daily.   FLUoxetine (PROZAC) 10 MG capsule Take 1 capsule (10 mg total) by mouth daily.   hydroxychloroquine (PLAQUENIL) 200 MG tablet Take 1 tablet by mouth every other day alternating with 2 tablets by mouth daily.   loratadine (CLARITIN) 10 MG tablet Take 10 mg by mouth daily as needed for allergies.    omega-3 fish oil (MAXEPA) 1000 MG CAPS capsule Take 1 capsule by mouth daily.   predniSONE (DELTASONE) 10 MG tablet Take 10 mg by mouth daily with breakfast.   No facility-administered medications prior to visit.   Reviewed past medical and social history.   ROS per HPI above      Objective:  BP  130/88   Pulse 87   Temp (!) 97.3 F (36.3 C) (Temporal)   Ht 5\' 7"  (1.702 m)   Wt 150 lb 9.6 oz (68.3 kg)   SpO2 98%   BMI 23.59 kg/m      Physical Exam Vitals reviewed.  Constitutional:      General: He is not in acute distress. Cardiovascular:     Rate and Rhythm: Normal rate.     Pulses: Normal pulses.  Pulmonary:     Effort: Pulmonary effort is normal.  Musculoskeletal:        General: Tenderness present.     Lumbar back: Normal.     Right hip: Normal.     Left hip: Normal.     Right upper leg: Normal.     Left upper leg: Normal.     Right knee: Effusion present. No erythema or crepitus. Tenderness present over the medial joint line and lateral joint line. No patellar tendon tenderness.     Left knee: Effusion present. No erythema or crepitus. Tenderness present over the medial joint line and lateral joint line. No patellar tendon tenderness.     Right lower leg: Normal. No edema.     Left lower leg: Normal. No edema.     Comments: Mild bilateral knee effusion  Skin:    General:  Skin is warm and dry.     Findings: No erythema or rash.  Neurological:     Mental Status: He is alert and oriented to person, place, and time.     Results for orders placed or performed in visit on 08/21/22  Uric acid  Result Value Ref Range   Uric Acid, Serum 9.5 (H) 4.0 - 7.8 mg/dL  CK  Result Value Ref Range   Total CK 56 7 - 232 U/L  C-reactive protein  Result Value Ref Range   CRP <1.0 0.5 - 20.0 mg/dL  CBC  Result Value Ref Range   WBC 4.2 4.0 - 10.5 K/uL   RBC 3.86 (L) 4.22 - 5.81 Mil/uL   Platelets 80.0 (L) 150.0 - 400.0 K/uL   Hemoglobin 13.0 13.0 - 17.0 g/dL   HCT 17.5 (L) 10.2 - 58.5 %   MCV 99.4 78.0 - 100.0 fl   MCHC 34.0 30.0 - 36.0 g/dL   RDW 27.7 (H) 82.4 - 23.5 %  Basic metabolic panel  Result Value Ref Range   Sodium 144 135 - 145 mEq/L   Potassium 2.8 (LL) 3.5 - 5.1 mEq/L   Chloride 102 96 - 112 mEq/L   CO2 34 (H) 19 - 32 mEq/L   Glucose, Bld 93 70 -  99 mg/dL   BUN 12 6 - 23 mg/dL   Creatinine, Ser 3.61 0.40 - 1.50 mg/dL   GFR 443.15 >40.08 mL/min   Calcium 8.6 8.4 - 10.5 mg/dL      Assessment & Plan:    Problem List Items Addressed This Visit       Other   Hypokalemia   Relevant Medications   potassium chloride SA (KLOR-CON M) 20 MEQ tablet   Other Relevant Orders   Uric acid   Basic metabolic panel   Other Visit Diagnoses     Pain in both lower extremities    -  Primary   Relevant Orders   Uric acid (Completed)   CK (Completed)   C-reactive protein (Completed)   CBC (Completed)   Basic metabolic panel (Completed)   Bilateral knee effusions       Relevant Medications   colchicine 0.6 MG tablet   allopurinol (ZYLOPRIM) 100 MG tablet   Other Relevant Orders   Uric acid (Completed)   Elevated uric acid in blood       Relevant Medications   colchicine 0.6 MG tablet   allopurinol (ZYLOPRIM) 100 MG tablet   Other Relevant Orders   Uric acid   Basic metabolic panel     Based on telephone noted yesterday, he declined to go to ED with very low potassium. LVM for f/up. I sent potassium supplement. He should start immediately. Schedule lab appt for repeat BMP in 1week. Elevated uric acid: start colchine and allopurinol. New rx sent Other labs are stable  Return if symptoms worsen or fail to improve.     Alysia Penna, NP

## 2022-08-21 NOTE — Patient Instructions (Signed)
Go to lab Limit tylenol use of 500mg  daily Use voltaren gel as directed on package

## 2022-08-21 NOTE — Telephone Encounter (Signed)
[facility]  Caller: Hope   Receiver: Sherif Millspaugh   Time: 4:30  Critical Value: Low Potassium 2.8

## 2022-08-22 MED ORDER — ALLOPURINOL 100 MG PO TABS: 100.0000 mg | ORAL_TABLET | Freq: Every day | ORAL | 5 refills | Status: DC

## 2022-08-22 MED ORDER — POTASSIUM CHLORIDE CRYS ER 20 MEQ PO TBCR: 20.0000 meq | EXTENDED_RELEASE_TABLET | Freq: Two times a day (BID) | ORAL | 0 refills | Status: DC

## 2022-08-22 MED ORDER — COLCHICINE 0.6 MG PO TABS: ORAL_TABLET | ORAL | 0 refills | Status: DC

## 2022-08-22 NOTE — Telephone Encounter (Signed)
Duplicate message called patient who declined ER visit.

## 2022-09-11 ENCOUNTER — Telehealth (HOSPITAL_COMMUNITY): Payer: Self-pay | Admitting: Licensed Clinical Social Worker

## 2022-09-11 ENCOUNTER — Encounter (HOSPITAL_COMMUNITY): Payer: Self-pay | Admitting: Licensed Clinical Social Worker

## 2022-09-11 NOTE — Telephone Encounter (Signed)
The therapist attempts to outreach this patient in response to a referral sent by Ms. Alysia Penna, NP; however, he is unable to leave a HIPAA-compliant voicemail as this patient's voicemail box is full.  Myrna Blazer, MA, LCSW, [facility], LCAS 09/11/2022

## 2022-09-30 DIAGNOSIS — K760 Fatty (change of) liver, not elsewhere classified: Secondary | ICD-10-CM | POA: Diagnosis not present

## 2022-09-30 DIAGNOSIS — I10 Essential (primary) hypertension: Secondary | ICD-10-CM | POA: Diagnosis not present

## 2022-11-03 DIAGNOSIS — D693 Immune thrombocytopenic purpura: Secondary | ICD-10-CM | POA: Diagnosis not present

## 2022-11-03 DIAGNOSIS — M329 Systemic lupus erythematosus, unspecified: Secondary | ICD-10-CM | POA: Diagnosis not present

## 2022-11-04 DIAGNOSIS — I10 Essential (primary) hypertension: Secondary | ICD-10-CM | POA: Diagnosis not present

## 2022-11-04 DIAGNOSIS — Z20822 Contact with and (suspected) exposure to covid-19: Secondary | ICD-10-CM | POA: Diagnosis not present

## 2022-11-04 DIAGNOSIS — Z03818 Encounter for observation for suspected exposure to other biological agents ruled out: Secondary | ICD-10-CM | POA: Diagnosis not present

## 2022-11-04 DIAGNOSIS — Z6822 Body mass index (BMI) 22.0-22.9, adult: Secondary | ICD-10-CM | POA: Diagnosis not present

## 2022-12-21 ENCOUNTER — Other Ambulatory Visit: Payer: Self-pay

## 2022-12-21 ENCOUNTER — Encounter (HOSPITAL_BASED_OUTPATIENT_CLINIC_OR_DEPARTMENT_OTHER): Payer: Self-pay | Admitting: Emergency Medicine

## 2022-12-21 ENCOUNTER — Emergency Department (HOSPITAL_BASED_OUTPATIENT_CLINIC_OR_DEPARTMENT_OTHER): Payer: Self-pay

## 2022-12-21 ENCOUNTER — Emergency Department (HOSPITAL_BASED_OUTPATIENT_CLINIC_OR_DEPARTMENT_OTHER)
Admission: EM | Admit: 2022-12-21 | Discharge: 2022-12-21 | Disposition: A | Discharge status: Home / Self Care | Payer: Self-pay | Attending: Emergency Medicine | Admitting: Emergency Medicine

## 2022-12-21 DIAGNOSIS — F10920 Alcohol use, unspecified with intoxication, uncomplicated: Secondary | ICD-10-CM | POA: Insufficient documentation

## 2022-12-21 DIAGNOSIS — D709 Neutropenia, unspecified: Secondary | ICD-10-CM | POA: Insufficient documentation

## 2022-12-21 DIAGNOSIS — M546 Pain in thoracic spine: Secondary | ICD-10-CM | POA: Insufficient documentation

## 2022-12-21 DIAGNOSIS — Y908 Blood alcohol level of 240 mg/100 ml or more: Secondary | ICD-10-CM | POA: Insufficient documentation

## 2022-12-21 DIAGNOSIS — M329 Systemic lupus erythematosus, unspecified: Secondary | ICD-10-CM | POA: Insufficient documentation

## 2022-12-21 LAB — COMPREHENSIVE METABOLIC PANEL
ALT: 173 U/L — ABNORMAL HIGH (ref 0–44)
AST: 561 U/L — ABNORMAL HIGH (ref 15–41)
Albumin: 3.7 g/dL (ref 3.5–5.0)
Alkaline Phosphatase: 176 U/L — ABNORMAL HIGH (ref 38–126)
Anion gap: 13 (ref 5–15)
BUN: 9 mg/dL (ref 6–20)
CO2: 26 mmol/L (ref 22–32)
Calcium: 7.7 mg/dL — ABNORMAL LOW (ref 8.9–10.3)
Chloride: 103 mmol/L (ref 98–111)
Creatinine, Ser: 0.66 mg/dL (ref 0.61–1.24)
GFR, Estimated: >60 mL/min (ref >60)
Glucose, Bld: 113 mg/dL — ABNORMAL HIGH (ref 70–99)
Potassium: 3.3 mmol/L — ABNORMAL LOW (ref 3.5–5.1)
Sodium: 142 mmol/L (ref 135–145)
Total Bilirubin: 1.7 mg/dL — ABNORMAL HIGH (ref 0.3–1.2)
Total Protein: 7.1 g/dL (ref 6.5–8.1)

## 2022-12-21 LAB — CBC WITH DIFFERENTIAL/PLATELET
Abs Immature Granulocytes: 0 10*3/uL (ref 0.00–0.07)
Basophils Absolute: 0 10*3/uL (ref 0.0–0.1)
Basophils Relative: 0 %
Eosinophils Absolute: 0 10*3/uL (ref 0.0–0.5)
Eosinophils Relative: 0 %
HCT: 38.3 % — ABNORMAL LOW (ref 39.0–52.0)
Hemoglobin: 13.2 g/dL (ref 13.0–17.0)
Immature Granulocytes: 0 %
Lymphocytes Relative: 47 %
Lymphs Abs: 0.3 10*3/uL — ABNORMAL LOW (ref 0.7–4.0)
MCH: 33.5 pg (ref 26.0–34.0)
MCHC: 34.5 g/dL (ref 30.0–36.0)
MCV: 97.2 fL (ref 80.0–100.0)
Monocytes Absolute: 0 10*3/uL — ABNORMAL LOW (ref 0.1–1.0)
Monocytes Relative: 6 %
Neutro Abs: 0.3 10*3/uL — CL (ref 1.7–7.7)
Neutrophils Relative %: 47 %
Platelets: 39 10*3/uL — ABNORMAL LOW (ref 150–400)
RBC: 3.94 MIL/uL — ABNORMAL LOW (ref 4.22–5.81)
RDW: 12.8 % (ref 11.5–15.5)
Smear Review: NORMAL
WBC: 0.7 10*3/uL — CL (ref 4.0–10.5)
nRBC: 0 % (ref 0.0–0.2)

## 2022-12-21 LAB — ETHANOL: Alcohol, Ethyl (B): 349 mg/dL (ref <10)

## 2022-12-21 MED ORDER — CLOBETASOL PROPIONATE 0.05 % EX CREA: 1.0000 | TOPICAL_CREAM | Freq: Two times a day (BID) | CUTANEOUS | 0 refills | Status: DC

## 2022-12-21 MED ORDER — CHLORDIAZEPOXIDE HCL 25 MG PO CAPS: ORAL_CAPSULE | ORAL | 0 refills | Status: DC

## 2022-12-21 MED ORDER — OXYCODONE-ACETAMINOPHEN 5-325 MG PO TABS
1.0000 | ORAL_TABLET | Freq: Once | ORAL | Status: AC
  Administered 2022-12-21: 1 via ORAL
  Filled 2022-12-21: qty 1

## 2022-12-21 NOTE — ED Triage Notes (Signed)
Pt arrives pov, to triage in wheelchair, with c/o upper and lower back pain after being "hit in the back" heavy furniture last night. Pt reports fall into furniture. Pt also reports hand numbness, redness and peeling of skin on extremities and torso. Reports tramadol today at 12pm, also took tylenol Pt reports etoh daily.

## 2022-12-21 NOTE — ED Provider Notes (Incomplete)
[facility] Provider Note   CSN: [id number] Arrival date & time: 12/21/22  1540     History Chief Complaint  Patient presents with  . Back Pain  . Rash    Chase Townsend is a 27 y.o. male.  Patient with a past history significant for tomorrow for outpatient stress lupus, pancytopenia, ITP presents to the emergency department complaints of back pain and a rash.   Back Pain Rash      Home Medications Prior to Admission medications   Medication Sig Start Date End Date Taking? Authorizing Provider  chlordiazePOXIDE (LIBRIUM) 25 MG capsule 50mg  PO QID x 1D, then 25 PO QID X 1D, then 25mg  PO BID X 1D, then 25mg  PO QHS 12/21/22  Yes Trashawn Oquendo A, PA-C  clobetasol cream (TEMOVATE) [id number] % Apply 1 Application topically 2 (two) times daily. 12/21/22  Yes Luvenia Heller, PA-C  acetaminophen (TYLENOL) 650 MG CR tablet Take 650 mg by mouth every 8 (eight) hours as needed for pain.    [provider]  allopurinol (ZYLOPRIM) 100 MG tablet Take 1 tablet (100 mg total) by mouth daily. 08/22/22   Nche, Charlene Brooke, NP  amLODipine (NORVASC) 10 MG tablet Take 1 tablet (10 mg total) by mouth daily. 08/07/22   Nche, Charlene Brooke, NP  carvedilol (COREG) 12.5 MG tablet Take 1 tablet (12.5 mg total) by mouth 2 (two) times daily. 08/07/22   Nche, Charlene Brooke, NP  colchicine 0.6 MG tablet 2tabs on day 1, then 1tab daily x 2days 08/22/22   Nche, Charlene Brooke, NP  FLUoxetine (PROZAC) 10 MG capsule Take 1 capsule (10 mg total) by mouth daily. 08/07/22   Nche, Charlene Brooke, NP  hydroxychloroquine (PLAQUENIL) 200 MG tablet Take 1 tablet by mouth every other day alternating with 2 tablets by mouth daily. 10/09/21     loratadine (CLARITIN) 10 MG tablet Take 10 mg by mouth daily as needed for allergies.     [provider]  omega-3 fish oil (MAXEPA) 1000 MG CAPS capsule Take 1 capsule by mouth daily.    [provider]  potassium chloride  SA (KLOR-CON M) 20 MEQ tablet Take 1 tablet (20 mEq total) by mouth 2 (two) times daily. 08/22/22   Nche, Charlene Brooke, NP  predniSONE (DELTASONE) 10 MG tablet Take 10 mg by mouth daily with breakfast.    [provider]      Allergies    Montelukast, Aspirin, Azathioprine, Ibuprofen, Singulair [montelukast sodium], and Amoxicillin-pot clavulanate    Review of Systems   Review of Systems  Musculoskeletal:  Positive for back pain.  Skin:  Positive for rash.    Physical Exam Updated Vital Signs BP (!) 144/102 (BP Location: Left Arm)   Pulse 80   Temp 97.7 F (36.5 C) (Oral)   Resp 16   Ht 5\' 7"  (1.702 m)   Wt 65.8 kg   SpO2 100%   BMI 22.71 kg/m  Physical Exam  ED Results / Procedures / Treatments   Labs (all labs ordered are listed, but only abnormal results are displayed) Labs Reviewed  COMPREHENSIVE METABOLIC PANEL - Abnormal; Notable for the following components:      Result Value   Potassium 3.3 (*)    Glucose, Bld 113 (*)    Calcium 7.7 (*)    AST 561 (*)    ALT 173 (*)    Alkaline Phosphatase 176 (*)    Total Bilirubin 1.7 (*)  All other components within normal limits  ETHANOL - Abnormal; Notable for the following components:   Alcohol, Ethyl (B) 349 (*)    All other components within normal limits  CBC WITH DIFFERENTIAL/PLATELET - Abnormal; Notable for the following components:   WBC 0.7 (*)    RBC 3.94 (*)    HCT 38.3 (*)    Platelets 39 (*)    Neutro Abs 0.3 (*)    Lymphs Abs 0.3 (*)    Monocytes Absolute 0.0 (*)    All other components within normal limits  CBC WITH DIFFERENTIAL/PLATELET    EKG EKG Interpretation  Date/Time:  Sunday December 21 2022 17:34:51 EDT Ventricular Rate:  70 PR Interval:  129 QRS Duration: 86 QT Interval:  421 QTC Calculation: 455 R Axis:   4 Text Interpretation: Sinus rhythm Borderline T abnormalities, diffuse leads when compared to prior, similar appearance. No STEMI Confirmed by Antony Blackbird [phone]) on  12/21/2022 6:25:42 PM  Radiology DG Chest 2 View  Result Date: 12/21/2022 CLINICAL DATA:  Back pain EXAM: CHEST - 2 VIEW COMPARISON:  None Available. FINDINGS: Lungs are symmetrically well expanded. Stable scattered benign calcified granuloma are seen within the lung bases. Lungs are otherwise clear. No pneumothorax or pleural effusion. Cardiac size within normal limits. Pulmonary vascularity is normal. No acute bone abnormality IMPRESSION: 1. No active cardiopulmonary disease. Electronically Signed   By: Fidela Salisbury M.D.   On: 12/21/2022 19:21   DG Lumbar Spine Complete  Result Date: 12/21/2022 CLINICAL DATA:  Back injury, back pain EXAM: LUMBAR SPINE - COMPLETE 4+ VIEW COMPARISON:  None Available. FINDINGS: Mild lumbar levoscoliosis, apex left at L3. Normal lumbar lordosis. No acute fracture or listhesis of the lumbar spine. Vertebral body height and intervertebral disc heights are preserved. Paraspinal soft tissues are unremarkable. IMPRESSION: 1. Mild lumbar levoscoliosis. No acute fracture or listhesis. Electronically Signed   By: Fidela Salisbury M.D.   On: 12/21/2022 19:20    Procedures Procedures   Medications Ordered in ED Medications  oxyCODONE-acetaminophen (PERCOCET/ROXICET) 5-325 MG per tablet 1 tablet (1 tablet Oral Given 12/21/22 2011)    ED Course/ Medical Decision Making/ A&P Clinical Course as of 12/21/22 2203  Sun Dec 21, 2022  2155 WBC(!!): 0.7 Significant leukopenia, specifically neutropenia. Hx of leukopenia [OZ]  2156 AST(!): 561 [OZ]  2156 Alcohol, Ethyl (B)(!!): 349 [OZ]  2156 Platelets(!): 39 [OZ]    Clinical Course User Index [OZ] Luvenia Heller, PA-C                           Medical Decision Making Amount and/or Complexity of Data Reviewed Labs: ordered. Radiology: ordered.   This patient presents to the ED for concern of *** differential diagnosis includes ***    Additional history obtained:  Additional history obtained from *** External  records from outside source obtained and reviewed including ***   Lab Tests:  I Ordered, and personally interpreted labs.  The pertinent results include:  ***   Imaging Studies ordered:  I ordered imaging studies including ***  I independently visualized and interpreted imaging which showed *** I agree with the radiologist interpretation   Medicines ordered and prescription drug management:  I ordered medication including ***  for ***  Reevaluation of the patient after these medicines showed that the patient {resolved/improved/worsened:23923::"improved"} I have reviewed the patients home medicines and have made adjustments as needed   Problem List / ED Course:  ***   Social  Determinants of Health:  Recently had insurance change and has been unable to see specialist team  Final Clinical Impression(s) / ED Diagnoses Final diagnoses:  Neutropenia, unspecified type ([place])  Alcoholic intoxication without complication ([place])  Acute bilateral thoracic back pain  SLE (systemic lupus erythematosus related syndrome) ([employer])    Rx / DC Orders ED Discharge Orders          Ordered    chlordiazePOXIDE (LIBRIUM) 25 MG capsule        12/21/22 2149    clobetasol cream (TEMOVATE) 0.05 %  2 times daily        12/21/22 2149

## 2022-12-21 NOTE — Discharge Instructions (Addendum)
You were seen in the emergency department for back pain and a rash. During the course of your evaluation, a low white blood count as well as thrombytopenia were noted as well. Please plan to follow up with your team of specialist at [facility] for further evaluation. If you begin to experiencing any concerning symptoms such as fever, weakness, chest pain, shortness of breath, please return too the ED for further evaluation. I have sent in a Librium taper to aid with alcohol detox and also provided you with some additional resources for alcohol detox here in the community.

## 2022-12-21 NOTE — ED Notes (Signed)
Pt in bed, pt states that he was moving furniture last night and fell and scraped/ bruised his back and now has back pain, pt has contusion and abrasion like area to lower back, tenderness to palpation, pt denies numbness or tingling to lower extremities, denies loss of bowel or bladder function.  Pt also reports a lupus flair up on his hands, arms and torso, pt states that he is taking steroids and another medication for his rash and it is getting better, but still hurts.

## 2022-12-22 ENCOUNTER — Telehealth: Payer: Self-pay

## 2022-12-22 NOTE — Transitions of Care (Post Inpatient/ED Visit) (Signed)
   12/22/2022  Name: Beaumont Whitenight MRN: [id number] DOB: 1996-08-16  Today's TOC FU Call Status: Today's TOC FU Call Status:: Unsuccessul Call (1st Attempt) Unsuccessful Call (1st Attempt) Date: 12/22/22  Attempted to reach the patient regarding the most recent Inpatient/ED visit.  Follow Up Plan: Additional outreach attempts will be made to reach the patient to complete the Transitions of Care (Post Inpatient/ED visit) call.   Signature  Angeline Slim, BSN, Therapist, sports

## 2022-12-23 ENCOUNTER — Telehealth: Payer: Self-pay | Admitting: Nurse Practitioner

## 2022-12-23 NOTE — Telephone Encounter (Signed)
Caller Name: Ciara/ wife Call back phone #: [phone]  Reason for Call: His wife stated he was rushed to the ER on Monday 3/18 and was prescribed Oxy/acetaminophen and percocet. He has been throwing up since he started taking it. He can't keep anything down. Is this a side effect and what do you advise?Marland Kitchen

## 2022-12-24 NOTE — Telephone Encounter (Signed)
Called Ciara back, pt's wife and made her aware of Charlotte's instructions

## 2022-12-25 ENCOUNTER — Telehealth: Payer: Self-pay | Admitting: Nurse Practitioner

## 2022-12-25 NOTE — Transitions of Care (Post Inpatient/ED Visit) (Signed)
   12/25/2022  Name: Chase Townsend MRN: [id number] DOB: 07/21/1996  Today's TOC FU Call Status: Today's TOC FU Call Status:: Successful TOC FU Call Competed Unsuccessful Call (1st Attempt) Date: 12/22/22 [facility] FU Call Complete Date: 12/25/22  Transition Care Management Follow-up Telephone Call Date of Discharge: 12/21/22 Discharge Facility: [facility] Type of Discharge: Emergency Department Reason for ED Visit: Other: (back pain, lupus flare up/rash) How have you been since you were released from the hospital?: Same Any questions or concerns?: No  Items Reviewed: Did you receive and understand the discharge instructions provided?: Yes Medications obtained and verified?: Yes (Medications Reviewed) Any new allergies since your discharge?: No Dietary orders reviewed?: NA Do you have support at home?: Yes People in Home: spouse  Home Care and Equipment/Supplies: [place] Ordered?: NA Any new equipment or medical supplies ordered?: NA  Functional Questionnaire: Do you need assistance with bathing/showering or dressing?: No Do you need assistance with meal preparation?: No Do you need assistance with eating?: No Do you have difficulty maintaining continence: No Do you need assistance with getting out of bed/getting out of a chair/moving?: No Do you have difficulty managing or taking your medications?: No  Follow up appointments reviewed: PCP Follow-up appointment confirmed?: No (pt declined) MD Provider Line [phone] Given: No [facility] Follow-up appointment confirmed?: NA Do you need transportation to your follow-up appointment?: No Do you understand care options if your condition(s) worsen?: Yes-patient verbalized understanding    SIGNATURE Angeline Slim, BSN, RN

## 2022-12-25 NOTE — Telephone Encounter (Signed)
Pt wife called and said the pt went to the emergency room Sunday because of his back pain but the pt has a rash on his arm and hand , the pt skin is peeling and want to know what medication can he use to help because the cream is not working. Please give his wife mrs Camper a call @ [phone]

## 2022-12-29 ENCOUNTER — Encounter: Payer: Self-pay | Admitting: Nurse Practitioner

## 2022-12-29 ENCOUNTER — Other Ambulatory Visit (HOSPITAL_BASED_OUTPATIENT_CLINIC_OR_DEPARTMENT_OTHER): Payer: Self-pay

## 2022-12-29 MED ORDER — PREDNISONE 10 MG PO TABS
ORAL_TABLET | ORAL | 0 refills | Status: DC
  Filled 2022-12-29: qty 70, 32d supply, fill #0

## 2022-12-29 MED ORDER — HYDROXYCHLOROQUINE SULFATE 200 MG PO TABS
200.0000 mg | ORAL_TABLET | ORAL | 1 refills | Status: AC
  Filled 2022-12-29: qty 135, 270d supply, fill #0

## 2023-01-01 ENCOUNTER — Other Ambulatory Visit (HOSPITAL_BASED_OUTPATIENT_CLINIC_OR_DEPARTMENT_OTHER): Payer: Self-pay

## 2023-01-06 ENCOUNTER — Ambulatory Visit: Payer: 59 | Admitting: Nurse Practitioner

## 2023-01-06 ENCOUNTER — Encounter: Payer: Self-pay | Admitting: Nurse Practitioner

## 2023-01-06 VITALS — BP 130/110 | HR 94 | Temp 98.2°F | Resp 16 | Ht 67.0 in | Wt 129.0 lb

## 2023-01-06 DIAGNOSIS — F1010 Alcohol abuse, uncomplicated: Secondary | ICD-10-CM | POA: Diagnosis not present

## 2023-01-06 DIAGNOSIS — F332 Major depressive disorder, recurrent severe without psychotic features: Secondary | ICD-10-CM | POA: Diagnosis not present

## 2023-01-06 DIAGNOSIS — I10 Essential (primary) hypertension: Secondary | ICD-10-CM | POA: Diagnosis not present

## 2023-01-06 MED ORDER — AMLODIPINE BESYLATE 10 MG PO TABS: 10.0000 mg | ORAL_TABLET | Freq: Every day | ORAL | 3 refills | Status: DC

## 2023-01-06 MED ORDER — CARVEDILOL 12.5 MG PO TABS: 12.5000 mg | ORAL_TABLET | Freq: Two times a day (BID) | ORAL | 3 refills | Status: DC

## 2023-01-06 NOTE — Assessment & Plan Note (Signed)
He continues to drink even with use of librium. [facility]ined [facility] contact [facility].

## 2023-01-06 NOTE — Progress Notes (Signed)
Established Patient Visit  Patient: Chase Townsend   DOB: 22-Apr-1996   27 y.o. Male  MRN: [id number] Visit Date: 01/06/2023  Subjective:    Chief Complaint  Patient presents with   Medical Management of Chronic Issues    Medication refill -   HPI Alcohol abuse He continues to drink even with use of librium. [facility].  Depression He discontinue fluoxetine and declined to schedule appt with psychology. States he is taking cymbalta prescribed by rheumatology. No improvement in mood Advised to schedule appt with psychologist as well as contact [facility]  Hypertension BP not at goal due to med non compliance. BP Readings from Last 3 Encounters:  01/06/23 (!) 130/110  12/21/22 (!) 136/108  08/21/22 130/88    Advised about adverse effects of uncontrolled HTN. Advised to take meds as prescribed F/up in 2weeks  Reviewed medical, surgical, and social history today  Medications: Outpatient Medications Prior to Visit  Medication Sig   traMADol (ULTRAM) 50 MG tablet Take 50 mg by mouth daily as needed.   acetaminophen (TYLENOL) 650 MG CR tablet Take 650 mg by mouth every 8 (eight) hours as needed for pain.   allopurinol (ZYLOPRIM) 100 MG tablet Take 1 tablet (100 mg total) by mouth daily.   chlordiazePOXIDE (LIBRIUM) 25 MG capsule 50mg  PO QID x 1D, then 25 PO QID X 1D, then 25mg  PO BID X 1D, then 25mg  PO QHS   clobetasol cream (TEMOVATE) [id number] % Apply 1 Application topically 2 (two) times daily.   hydroxychloroquine (PLAQUENIL) 200 MG tablet Take 1 tablet by mouth every other day alternating with 2 tablets by mouth daily.   hydroxychloroquine (PLAQUENIL) 200 MG tablet Take 1 tablet (200 mg total) by mouth every other day. Alternating with 400 mg   loratadine (CLARITIN) 10 MG tablet Take 10 mg by mouth daily as needed for allergies.    omega-3 fish oil (MAXEPA) 1000 MG CAPS capsule Take 1 capsule by mouth daily.   potassium chloride SA  (KLOR-CON M) 20 MEQ tablet Take 1 tablet (20 mEq total) by mouth 2 (two) times daily.   predniSONE (DELTASONE) 10 MG tablet Take 10 mg by mouth daily with breakfast.   predniSONE (DELTASONE) 10 MG tablet Take 4 tablets (40 mg total) by mouth once daily for 7 days, THEN 3 tablets (30 mg total) once daily for 7 days, THEN 2 tablets (20 mg total) once daily for 7 days, THEN 1 tablet (10 mg total) once daily for 7 days.   [DISCONTINUED] amLODipine (NORVASC) 10 MG tablet Take 1 tablet (10 mg total) by mouth daily.   [DISCONTINUED] carvedilol (COREG) 12.5 MG tablet Take 1 tablet (12.5 mg total) by mouth 2 (two) times daily.   [DISCONTINUED] colchicine 0.6 MG tablet 2tabs on day 1, then 1tab daily x 2days   [DISCONTINUED] FLUoxetine (PROZAC) 10 MG capsule Take 1 capsule (10 mg total) by mouth daily.   No facility-administered medications prior to visit.   Reviewed past medical and social history.   ROS per HPI above      Objective:  BP (!) 130/110 (BP Location: Left Arm, Patient Position: Sitting, Cuff Size: Normal)   Pulse 94   Temp 98.2 F (36.8 C) (Temporal)   Resp 16   Ht 5\' 7"  (1.702 m)   Wt 129 lb (58.5 kg)   SpO2 97%   BMI 20.20 kg/m  Physical Exam  No results found for any visits on 01/06/23.    Assessment & Plan:    Problem List Items Addressed This Visit       Cardiovascular and Mediastinum   Hypertension    BP not at goal due to med non compliance. BP Readings from Last 3 Encounters:  01/06/23 (!) 130/110  12/21/22 (!) 136/108  08/21/22 130/88    Advised about adverse effects of uncontrolled HTN. Advised to take meds as prescribed F/up in 2weeks      Relevant Medications   amLODipine (NORVASC) 10 MG tablet   carvedilol (COREG) 12.5 MG tablet     Other   Alcohol abuse    He continues to drink even with use of librium. [facility].      Depression - Primary    He discontinue fluoxetine and declined to schedule appt with  psychology. States he is taking cymbalta prescribed by rheumatology. No improvement in mood Advised to schedule appt with psychologist as well as contact [facility]      Return in about 2 weeks (around 01/20/2023) for depression and anxiety, HTN.     Wilfred Lacy, NP

## 2023-01-06 NOTE — Assessment & Plan Note (Signed)
BP not at goal due to med non compliance. BP Readings from Last 3 Encounters:  01/06/23 (!) 130/110  12/21/22 (!) 136/108  08/21/22 130/88    Advised about adverse effects of uncontrolled HTN. Advised to take meds as prescribed F/up in 2weeks

## 2023-01-06 NOTE — Patient Instructions (Addendum)
It is very important for you to reach out to [facility] Schedule f/up appt with rheumatology and hepatologist. Bring all meds to next appt. Take meds as prescribed.

## 2023-01-06 NOTE — Assessment & Plan Note (Signed)
He discontinue fluoxetine and declined to schedule appt with psychology. States he is taking cymbalta prescribed by rheumatology. No improvement in mood Advised to schedule appt with psychologist as well as contact [facility]

## 2023-01-12 ENCOUNTER — Telehealth: Payer: Self-pay | Admitting: Nurse Practitioner

## 2023-01-12 NOTE — Telephone Encounter (Signed)
Caller Name: Demar Call back phone #: [phone]  Reason for Call: Pt was told by his employer he needs a return to work note. He would like it sent to his mychart. He will wait for your response.

## 2023-01-14 ENCOUNTER — Telehealth: Payer: Self-pay | Admitting: Nurse Practitioner

## 2023-01-14 NOTE — Telephone Encounter (Signed)
Pt is under the impression he is getting a referral for a therapist for depression. Please advise pt at [phone].

## 2023-01-20 ENCOUNTER — Encounter: Payer: Self-pay | Admitting: Nurse Practitioner

## 2023-01-20 ENCOUNTER — Ambulatory Visit (INDEPENDENT_AMBULATORY_CARE_PROVIDER_SITE_OTHER): Payer: 59 | Admitting: Nurse Practitioner

## 2023-01-20 VITALS — BP 130/90 | HR 83 | Temp 98.2°F | Resp 16 | Ht 67.0 in | Wt 131.2 lb

## 2023-01-20 DIAGNOSIS — E7220 Disorder of urea cycle metabolism, unspecified: Secondary | ICD-10-CM

## 2023-01-20 DIAGNOSIS — F332 Major depressive disorder, recurrent severe without psychotic features: Secondary | ICD-10-CM

## 2023-01-20 DIAGNOSIS — I10 Essential (primary) hypertension: Secondary | ICD-10-CM | POA: Diagnosis not present

## 2023-01-20 DIAGNOSIS — M3219 Other organ or system involvement in systemic lupus erythematosus: Secondary | ICD-10-CM | POA: Diagnosis not present

## 2023-01-20 DIAGNOSIS — D693 Immune thrombocytopenic purpura: Secondary | ICD-10-CM | POA: Diagnosis not present

## 2023-01-20 HISTORY — DX: Disorder of urea cycle metabolism, unspecified: E72.20

## 2023-01-20 MED ORDER — CLOBETASOL PROPIONATE 0.05 % EX CREA: 1.0000 | TOPICAL_CREAM | Freq: Two times a day (BID) | CUTANEOUS | 0 refills | Status: DC

## 2023-01-20 NOTE — Assessment & Plan Note (Addendum)
Reports improved mood. No SI/HI/hallucination Reports cymbalta was discontinued in past due to side effects (worsening anxiety) Did not bring bottles of medications as previously requested. States he had a session with therapist via EAP program, but plans to schedule appt with [facility] therapist. Unable to remember name of current antidepressant prescribed by rheumatology. Advised to also [facility] due to current ETOH abuse.

## 2023-01-20 NOTE — Progress Notes (Signed)
Established Patient Visit  Patient: Chase Townsend   DOB: [birth year]   27 y.o. Male  MRN: [id number] Visit Date: 01/20/2023  Subjective:    Chief Complaint  Patient presents with   Depression    He is feeling better    Medication Refill    Clobetasol    HPI Depression Reports improved mood. No SI/HI/hallucination Reports cymbalta was discontinued in past due to side effects (worsening anxiety) Did not bring bottles of medications as previously requested. States he had a session with therapist via EAP program, but plans to schedule appt with [facility] therapist. Unable to remember name of current antidepressant prescribed by rheumatology. Advised to also [facility] due to current ETOH abuse.  Systemic lupus erythematosus (HCC) Reports he has upcoming appt with rheumatology on 01/23/23. Needs refill on clobetasol cream for skin lesion secondary to lupus. 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 BP Readings from Last 3 Encounters:  01/20/23 (!) 130/90  01/06/23 (!) 130/110  12/21/22 (!) 136/108    Advised to take meds as prescribed and maintain low sodium diet. No change in meds at this time  Reviewed medical, surgical, and social history today  Medications: Outpatient Medications Prior to Visit  Medication Sig   acetaminophen (TYLENOL) 650 MG CR tablet Take 650 mg by mouth every 8 (eight) hours as needed for pain.   allopurinol (ZYLOPRIM) 100 MG tablet Take 1 tablet (100 mg total) by mouth daily.   amLODipine (NORVASC) 10 MG tablet Take 1 tablet (10 mg total) by  mouth daily.   carvedilol (COREG) 12.5 MG tablet Take 1 tablet (12.5 mg total) by mouth 2 (two) times daily.   chlordiazePOXIDE (LIBRIUM) 25 MG capsule  PO QID x 1D, then 25 PO QID X 1D, then  PO BID X 1D, then  PO QHS   hydroxychloroquine (PLAQUENIL) 200 MG tablet Take 1 tablet by mouth every other day alternating with 2 tablets by mouth daily.   hydroxychloroquine (PLAQUENIL) 200 MG tablet Take 1 tablet (200 mg total) by mouth every other day. Alternating with 400 mg   loratadine (CLARITIN) 10 MG tablet Take 10 mg by mouth daily as needed for allergies.    omega-3 fish oil (MAXEPA) 1000 MG CAPS capsule Take 1 capsule by mouth daily.   potassium chloride SA (KLOR-CON M) 20 MEQ tablet Take 1 tablet (20 mEq total) by mouth 2 (two) times daily.   predniSONE (DELTASONE) 10 MG tablet Take 10 mg by mouth daily with breakfast.   predniSONE (DELTASONE) 10 MG tablet Take 4 tablets (40 mg total) by mouth once daily for 7 days, THEN 3 tablets (30 mg total) once daily for 7 days, THEN 2 tablets (20 mg total) once daily for 7 days, THEN 1 tablet (10 mg total) once daily for 7 days.   traMADol (ULTRAM) 50 MG tablet Take 50 mg  by mouth daily as needed.   [DISCONTINUED] clobetasol cream (TEMOVATE) 0.05 % Apply 1 Application topically 2 (two) times daily.   No facility-administered medications prior to visit.   Reviewed past medical and social history.   ROS per HPI above      Objective:  BP (!) 130/90   Pulse 83   Temp 98.2 F (36.8 C) (Temporal)   Resp 16   Ht  (1.702 m)   Wt 131 lb 3.2 oz (59.5 kg)   SpO2 98%   BMI 20.55 kg/m      Physical Exam  No results found for any visits on 01/20/23.    Assessment & Plan:    Problem List Items Addressed This Visit       Cardiovascular and Mediastinum   Hypertension    Elevated BP today due to lack of med dose. BP Readings from Last 3 Encounters:  01/20/23 (!) 130/90  01/06/23 (!) 130/110  12/21/22 (!) 136/108    Advised to  take meds as prescribed and maintain low sodium diet. No change in meds at this time        Musculoskeletal and Integument   Immune thrombocytopenic purpura    Followed by hematology     Latest Ref Rng & Units 12/21/2022    7:08 PM 08/21/2022   11:30 AM 03/21/2022   10:15 AM  CBC  WBC 4.0 - 10.5 K/uL 0.7  4.2  1.9   Hemoglobin 13.0 - 17.0 g/dL 16.1  09.6  04.5   Hematocrit 39.0 - 52.0 % 38.3  38.3  38.9   Platelets 150 - 400 K/uL 39  80.0  26            Other   Depression - Primary    Reports improved mood. No SI/HI/hallucination Reports cymbalta was discontinued in past due to side effects (worsening anxiety) Did not bring bottles of medications as previously requested. States he had a session with therapist via EAP program, but plans to schedule appt with [facility] therapist. Unable to remember name of current antidepressant prescribed by rheumatology. Advised to also [facility] due to current ETOH abuse.      Systemic lupus erythematosus (HCC)    Reports he has upcoming appt with rheumatology on 01/23/23. Needs refill on clobetasol cream for skin lesion secondary to lupus. Secondary refill sent      Return in about 3 months (around 04/21/2023) for depression and anxiety.     Alysia Penna, NP

## 2023-01-20 NOTE — Assessment & Plan Note (Signed)
Followed by hematology     Latest Ref Rng & Units 12/21/2022    7:08 PM 08/21/2022   11:30 AM 03/21/2022   10:15 AM  CBC  WBC 4.0 - 10.5 K/uL 0.7  4.2  1.9   Hemoglobin 13.0 - 17.0 g/dL 40.9  81.1  91.4   Hematocrit 39.0 - 52.0 % 38.3  38.3  38.9   Platelets 150 - 400 K/uL 39  80.0  26

## 2023-01-20 NOTE — Patient Instructions (Signed)
Schedule appt with psychologist and with [facility]. Send picture of all current medications via mychart Ok to return to work

## 2023-01-20 NOTE — Assessment & Plan Note (Signed)
Elevated BP today due to lack of med dose. BP Readings from Last 3 Encounters:  01/20/23 (!) 130/90  01/06/23 (!) 130/110  12/21/22 (!) 136/108    Advised to take meds as prescribed and maintain low sodium diet. No change in meds at this time

## 2023-01-20 NOTE — Assessment & Plan Note (Signed)
Reports he has upcoming appt with rheumatology on 01/23/23. Needs refill on clobetasol cream for skin lesion secondary to lupus. Secondary refill sent

## 2023-01-26 ENCOUNTER — Telehealth: Payer: Self-pay | Admitting: Nurse Practitioner

## 2023-01-26 NOTE — Telephone Encounter (Signed)
Caller Name: Lukka Call back phone #: [phone]  Reason for Call: pt called stating he went to his Rheumatologist. Pt has lupus and she is wanting him to have CBC weekly. He believes she was sending a message to [place]. Can he come here for weekly CBC?

## 2023-01-26 NOTE — Telephone Encounter (Signed)
Called and left detailed message. His Rheumatologist is with [facility] and he will need to get his labs drawn with them

## 2023-01-28 ENCOUNTER — Telehealth: Payer: Self-pay | Admitting: Nurse Practitioner

## 2023-01-28 NOTE — Telephone Encounter (Signed)
Caller Name: Zakye Call back phone #: [phone]  Reason for Call: Pt was told he needed 2 labs drawn. They have faxed the request. He wants this appt on Friday 4/26 in the morning. I did not see an order from [place] so I did not schedule yet.

## 2023-01-31 ENCOUNTER — Encounter (HOSPITAL_BASED_OUTPATIENT_CLINIC_OR_DEPARTMENT_OTHER): Payer: Self-pay | Admitting: Emergency Medicine

## 2023-01-31 ENCOUNTER — Emergency Department (HOSPITAL_BASED_OUTPATIENT_CLINIC_OR_DEPARTMENT_OTHER)
Admission: EM | Admit: 2023-01-31 | Discharge: 2023-01-31 | Disposition: A | Discharge status: Home / Self Care | Payer: 59 | Attending: Emergency Medicine | Admitting: Emergency Medicine

## 2023-01-31 ENCOUNTER — Other Ambulatory Visit: Payer: Self-pay

## 2023-01-31 DIAGNOSIS — R531 Weakness: Secondary | ICD-10-CM | POA: Diagnosis not present

## 2023-01-31 DIAGNOSIS — I10 Essential (primary) hypertension: Secondary | ICD-10-CM | POA: Diagnosis not present

## 2023-01-31 DIAGNOSIS — F1090 Alcohol use, unspecified, uncomplicated: Secondary | ICD-10-CM | POA: Insufficient documentation

## 2023-01-31 DIAGNOSIS — Y908 Blood alcohol level of 240 mg/100 ml or more: Secondary | ICD-10-CM | POA: Insufficient documentation

## 2023-01-31 DIAGNOSIS — Z79899 Other long term (current) drug therapy: Secondary | ICD-10-CM | POA: Insufficient documentation

## 2023-01-31 LAB — URINALYSIS, ROUTINE W REFLEX MICROSCOPIC
Bilirubin Urine: NEGATIVE
Glucose, UA: NEGATIVE mg/dL
Hgb urine dipstick: NEGATIVE
Ketones, ur: NEGATIVE mg/dL
Leukocytes,Ua: NEGATIVE
Nitrite: NEGATIVE
Protein, ur: NEGATIVE mg/dL
Specific Gravity, Urine: 1.019 (ref 1.005–1.030)
pH: 7 (ref 5.0–8.0)

## 2023-01-31 LAB — CBC
HCT: 36.5 % — ABNORMAL LOW (ref 39.0–52.0)
Hemoglobin: 12.6 g/dL — ABNORMAL LOW (ref 13.0–17.0)
MCH: 35.4 pg — ABNORMAL HIGH (ref 26.0–34.0)
MCHC: 34.5 g/dL (ref 30.0–36.0)
MCV: 102.5 fL — ABNORMAL HIGH (ref 80.0–100.0)
Platelets: 27 10*3/uL — CL (ref 150–400)
RBC: 3.56 MIL/uL — ABNORMAL LOW (ref 4.22–5.81)
RDW: 16.8 % — ABNORMAL HIGH (ref 11.5–15.5)
WBC: 3.1 10*3/uL — ABNORMAL LOW (ref 4.0–10.5)
nRBC: 1.6 % — ABNORMAL HIGH (ref 0.0–0.2)

## 2023-01-31 LAB — COMPREHENSIVE METABOLIC PANEL
ALT: 95 U/L — ABNORMAL HIGH (ref 0–44)
AST: 270 U/L — ABNORMAL HIGH (ref 15–41)
Albumin: 3.6 g/dL (ref 3.5–5.0)
Alkaline Phosphatase: 200 U/L — ABNORMAL HIGH (ref 38–126)
Anion gap: 12 (ref 5–15)
BUN: 7 mg/dL (ref 6–20)
CO2: 31 mmol/L (ref 22–32)
Calcium: 8.1 mg/dL — ABNORMAL LOW (ref 8.9–10.3)
Chloride: 103 mmol/L (ref 98–111)
Creatinine, Ser: 0.58 mg/dL — ABNORMAL LOW (ref 0.61–1.24)
GFR, Estimated: >60 mL/min (ref >60)
Glucose, Bld: 89 mg/dL (ref 70–99)
Potassium: 2.8 mmol/L — ABNORMAL LOW (ref 3.5–5.1)
Sodium: 146 mmol/L — ABNORMAL HIGH (ref 135–145)
Total Bilirubin: 3.5 mg/dL — ABNORMAL HIGH (ref 0.3–1.2)
Total Protein: 6.2 g/dL — ABNORMAL LOW (ref 6.5–8.1)

## 2023-01-31 LAB — PROTIME-INR
INR: 1.1 (ref 0.8–1.2)
Prothrombin Time: 14.1 seconds (ref 11.4–15.2)

## 2023-01-31 LAB — MAGNESIUM: Magnesium: 2 mg/dL (ref 1.7–2.4)

## 2023-01-31 LAB — AMMONIA: Ammonia: 80 umol/L — ABNORMAL HIGH (ref 9–35)

## 2023-01-31 LAB — ETHANOL: Alcohol, Ethyl (B): 384 mg/dL (ref <10)

## 2023-01-31 MED ORDER — CHLORDIAZEPOXIDE HCL 25 MG PO CAPS: ORAL_CAPSULE | ORAL | 0 refills | Status: DC

## 2023-01-31 MED ORDER — LACTULOSE 10 GM/15ML PO SOLN: 10.0000 g | Freq: Three times a day (TID) | ORAL | 0 refills | Status: DC

## 2023-01-31 MED ORDER — FOLIC ACID 1 MG PO TABS
1.0000 mg | ORAL_TABLET | Freq: Once | ORAL | Status: AC
  Administered 2023-01-31: 1 mg via ORAL
  Filled 2023-01-31: qty 1

## 2023-01-31 MED ORDER — ONDANSETRON HCL 4 MG PO TABS: 4.0000 mg | ORAL_TABLET | Freq: Four times a day (QID) | ORAL | 0 refills | Status: DC | PRN

## 2023-01-31 MED ORDER — THIAMINE MONONITRATE 100 MG PO TABS
100.0000 mg | ORAL_TABLET | Freq: Every day | ORAL | Status: DC
  Administered 2023-01-31: 100 mg via ORAL
  Filled 2023-01-31: qty 1

## 2023-01-31 MED ORDER — POTASSIUM CHLORIDE 10 MEQ/100ML IV SOLN
10.0000 meq | INTRAVENOUS | Status: AC
  Administered 2023-01-31 (×2): 10 meq via INTRAVENOUS
  Filled 2023-01-31 (×2): qty 100

## 2023-01-31 MED ORDER — POTASSIUM CHLORIDE CRYS ER 20 MEQ PO TBCR
40.0000 meq | EXTENDED_RELEASE_TABLET | Freq: Once | ORAL | Status: AC
  Administered 2023-01-31: 40 meq via ORAL
  Filled 2023-01-31: qty 2

## 2023-01-31 MED ORDER — ADULT MULTIVITAMIN W/MINERALS CH
1.0000 | ORAL_TABLET | Freq: Once | ORAL | Status: AC
  Administered 2023-01-31: 1 via ORAL
  Filled 2023-01-31: qty 1

## 2023-01-31 MED ORDER — LACTATED RINGERS IV BOLUS
1000.0000 mL | Freq: Once | INTRAVENOUS | Status: AC
  Administered 2023-01-31: 1000 mL via INTRAVENOUS

## 2023-01-31 MED ORDER — POTASSIUM CHLORIDE CRYS ER 20 MEQ PO TBCR: 20.0000 meq | EXTENDED_RELEASE_TABLET | Freq: Two times a day (BID) | ORAL | 0 refills | Status: DC

## 2023-01-31 NOTE — ED Triage Notes (Signed)
Pt recently started prozac, pt has no appetite, and very weak, not eat anything past 4 days.pt does have lupus

## 2023-01-31 NOTE — ED Provider Notes (Signed)
[place] EMERGENCY DEPARTMENT AT [facility] Provider Note   CSN: [id number] Arrival date & time: 01/31/23  0945     History No chief complaint on file.   Chase Townsend is a 27 y.o. male with medical history of lupus, hypertension, depression, clotting disorder, alcohol use disorder.  Patient presents to ED for evaluation of weakness, decreased appetite.  Patient states that for the last 60 days, ending on 4/2, he had been without his lupus medication.  Patient reports that this was due to insurance issues.  Patient states that ever since 4/2 he has been compliant on lupus medication.  Patient states he also recently started Prozac for depression secondary to losing his grandmother.  Patient goes on to tell me that he also has issues with alcohol use disorder.  Patient reports that his last alcoholic beverage was this morning around 7 AM, he drank 4 airplane bottles of liquor.  Patient unsure of how long he has been abusing alcohol.  Patient does state he has gone into withdrawals in the past.  Patient denies any anxiety, tremors, shortness of breath, chest pain, fevers, nausea, vomiting, abdominal pain.  The patient reports that he has been drinking a lot of water recently and he is having diarrhea secondary to this he states.  HPI     Home Medications Prior to Admission medications   Medication Sig Start Date End Date Taking? Authorizing Provider  chlordiazePOXIDE (LIBRIUM) 25 MG capsule 50mg  PO TID x 1D, then 25-50mg  PO BID X 1D, then 25-50mg  PO QD X 1D 01/31/23  Yes Asami Lambright F, PA-C  lactulose (CHRONULAC) 10 GM/15ML solution Take 15 mLs (10 g total) by mouth 3 (three) times daily. 01/31/23  Yes Al Decant, PA-C  ondansetron (ZOFRAN) 4 MG tablet Take 1 tablet (4 mg total) by mouth every 6 (six) hours as needed for nausea or vomiting. 01/31/23  Yes Al Decant, PA-C  potassium chloride SA (KLOR-CON M) 20 MEQ tablet Take 1 tablet (20 mEq total) by mouth 2  (two) times daily. 01/31/23  Yes Al Decant, PA-C  acetaminophen (TYLENOL) 650 MG CR tablet Take 650 mg by mouth every 8 (eight) hours as needed for pain.    [provider]  allopurinol (ZYLOPRIM) 100 MG tablet Take 1 tablet (100 mg total) by mouth daily. 08/22/22   Nche, Bonna Gains, NP  amLODipine (NORVASC) 10 MG tablet Take 1 tablet (10 mg total) by mouth daily. 01/06/23   Nche, Bonna Gains, NP  carvedilol (COREG) 12.5 MG tablet Take 1 tablet (12.5 mg total) by mouth 2 (two) times daily. 01/06/23   Nche, Bonna Gains, NP  clobetasol cream (TEMOVATE) 0.05 % Apply 1 Application topically 2 (two) times daily. 01/20/23   Nche, Bonna Gains, NP  hydroxychloroquine (PLAQUENIL) 200 MG tablet Take 1 tablet by mouth every other day alternating with 2 tablets by mouth daily. 10/09/21     hydroxychloroquine (PLAQUENIL) 200 MG tablet Take 1 tablet (200 mg total) by mouth every other day. Alternating with 400 mg 12/29/22     loratadine (CLARITIN) 10 MG tablet Take 10 mg by mouth daily as needed for allergies.     [provider]  omega-3 fish oil (MAXEPA) 1000 MG CAPS capsule Take 1 capsule by mouth daily.    [provider]  predniSONE (DELTASONE) 10 MG tablet Take 10 mg by mouth daily with breakfast.    [provider]  predniSONE (DELTASONE) 10 MG tablet Take 4 tablets (40 mg total)  by mouth once daily for 7 days, THEN 3 tablets (30 mg total) once daily for 7 days, THEN 2 tablets (20 mg total) once daily for 7 days, THEN 1 tablet (10 mg total) once daily for 7 days. 12/29/22     traMADol (ULTRAM) 50 MG tablet Take 50 mg by mouth daily as needed. 08/21/22   [provider]      Allergies    Montelukast, Aspirin, Azathioprine, Ibuprofen, Singulair [montelukast sodium], and Amoxicillin-pot clavulanate    Review of Systems   Review of Systems  Constitutional:  Positive for appetite change and fatigue.  Neurological:  Positive for weakness.  All other  systems reviewed and are negative.   Physical Exam Updated Vital Signs BP (!) 140/98   Pulse 81   Temp 98.2 F (36.8 C) (Oral)   Resp 19   SpO2 100%  Physical Exam Vitals and nursing note reviewed.  Constitutional:      General: He is not in acute distress.    Appearance: Normal appearance. He is not ill-appearing, toxic-appearing or diaphoretic.  HENT:     Head: Normocephalic and atraumatic.     Nose: Nose normal. No congestion.     Mouth/Throat:     Mouth: Mucous membranes are moist.     Pharynx: Oropharynx is clear.  Eyes:     Extraocular Movements: Extraocular movements intact.     Conjunctiva/sclera: Conjunctivae normal.     Pupils: Pupils are equal, round, and reactive to light.  Cardiovascular:     Rate and Rhythm: Normal rate and regular rhythm.  Pulmonary:     Effort: Pulmonary effort is normal.     Breath sounds: Normal breath sounds. No wheezing.  Abdominal:     General: Abdomen is flat. Bowel sounds are normal.     Palpations: Abdomen is soft.     Tenderness: There is no abdominal tenderness. There is no right CVA tenderness or left CVA tenderness.  Musculoskeletal:     Cervical back: Normal range of motion and neck supple. No tenderness.  Skin:    General: Skin is warm and dry.     Capillary Refill: Capillary refill takes less than 2 seconds.  Neurological:     General: No focal deficit present.     Mental Status: He is alert and oriented to person, place, and time.     GCS: GCS eye subscore is 4. GCS verbal subscore is 5. GCS motor subscore is 6.     Cranial Nerves: Cranial nerves 2-12 are intact. No cranial nerve deficit.     Sensory: Sensation is intact. No sensory deficit.     Motor: Motor function is intact. No weakness.     Coordination: Coordination is intact. Heel to [facility] Test normal.     ED Results / Procedures / Treatments   Labs (all labs ordered are listed, but only abnormal results are displayed) Labs Reviewed  CBC - Abnormal; Notable  for the following components:      Result Value   WBC 3.1 (*)    RBC 3.56 (*)    Hemoglobin 12.6 (*)    HCT 36.5 (*)    MCV 102.5 (*)    MCH 35.4 (*)    RDW 16.8 (*)    Platelets 27 (*)    nRBC 1.6 (*)    All other components within normal limits  COMPREHENSIVE METABOLIC PANEL - Abnormal; Notable for the following components:   Sodium 146 (*)    Potassium 2.8 (*)  Creatinine, Ser 0.58 (*)    Calcium 8.1 (*)    Total Protein 6.2 (*)    AST 270 (*)    ALT 95 (*)    Alkaline Phosphatase 200 (*)    Total Bilirubin 3.5 (*)    All other components within normal limits  ETHANOL - Abnormal; Notable for the following components:   Alcohol, Ethyl (B) 384 (*)    All other components within normal limits  AMMONIA - Abnormal; Notable for the following components:   Ammonia 80 (*)    All other components within normal limits  URINALYSIS, ROUTINE W REFLEX MICROSCOPIC  MAGNESIUM  PROTIME-INR    EKG None  Radiology No results found.  Procedures Procedures   Medications Ordered in ED Medications  thiamine (VITAMIN B1) tablet 100 mg (100 mg Oral Given 01/31/23 1038)  folic acid (FOLVITE) tablet 1 mg (1 mg Oral Given 01/31/23 1038)  multivitamin with minerals tablet 1 tablet (1 tablet Oral Given 01/31/23 1038)  lactated ringers bolus 1,000 mL (0 mLs Intravenous Stopped 01/31/23 1201)  potassium chloride 10 mEq in 100 mL IVPB (0 mEq Intravenous Stopped 01/31/23 1612)  potassium chloride SA (KLOR-CON M) CR tablet 40 mEq (40 mEq Oral Given 01/31/23 1336)    ED Course/ Medical Decision Making/ A&P  Medical Decision Making Amount and/or Complexity of Data Reviewed Labs: ordered.  Risk OTC drugs. Prescription drug management.   27 year old male presents to the ED for evaluation of weakness.  Please see HPI for further details.  On examination the patient is afebrile and nontachycardic.  Lung sounds are clear bilaterally, he is not hypoxic.  His abdomen is soft and compressible  throughout without tenderness.  Neurological examination at baseline.  Patient we worked up utilizing CBC, CMP, magnesium, ammonia, PT/INR, ethanol, urinalysis.  CBC without leukocytosis, hemoglobin 12.6 baseline.  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  Patient reports he is compliant on his steroids.  Patient recently saw his rheumatologist on Friday at [facility] with reassuring workup.    Patient CMP with sodium 146, potassium 2.8, AST 270, ALT 95, alk phos 200, total bilirubin 3.5.  Patient provided 20 mEq IV potassium, 40 mEq oral potassium.  1 month ago patient AST 561, ALT 173.  1 day ago patient alk phos 176, today 200.  1 month ago patient bilirubin 3.5.  Patient ethanol elevated at 384.  PT/INR unremarkable.  Magnesium 2.0.  Urinalysis unremarkable.  Ammonia elevated 88.  1 year ago patient ammonia 71.  The patient was given 1 L LR, 500 mL normal saline.  Patient provided 20 total mEq IV potassium, 40 oral potassium.  Patient provided 1 mg folic acid, multivitamin, thiamine.  Patient case discussed attending Dr. Dalene Seltzer.  Dr. Dalene Seltzer states that patient may be discharged home with GI referral, lactulose, oral potassium, Zofran, Librium, outpatient resources for substance abuse counseling.  The patient is amenable to this plan.  At this time the patient stable for discharge home.  The patient was given outpatient resources for substance abuse counseling.  The patient was referred to GI and advised to follow-up on Monday.  Patient was given Librium taper.  Patient also given oral Zofran, lactulose for elevated ammonia.  The patient will follow-up with GI this Monday, will follow-up with his rheumatologist as well at [facility].  Patient will also begin calling to substance abuse treatment centers patient given return precautions and he voiced understanding.  Patient  had all of his questions answered to his satisfaction.  Patient  stable for discharge.   Final Clinical Impression(s) / ED Diagnoses Final diagnoses:  Weakness  Alcohol use disorder    Rx / DC Orders ED Discharge Orders          Ordered    chlordiazePOXIDE (LIBRIUM) 25 MG capsule        01/31/23 1639    lactulose (CHRONULAC) 10 GM/15ML solution  3 times daily        01/31/23 1639    ondansetron (ZOFRAN) 4 MG tablet  Every 6 hours PRN        01/31/23 1639    potassium chloride SA (KLOR-CON M) 20 MEQ tablet  2 times daily        01/31/23 1646              Al Decant, PA-C 01/31/23 1659    Alvira Monday, MD 01/31/23 2158

## 2023-01-31 NOTE — ED Notes (Signed)
Called lab to add Mag level, spoke with Shanda Bumps.

## 2023-01-31 NOTE — Discharge Instructions (Addendum)
Return to the ED with any new or worsening signs or symptoms Please begin taking Librium taper I placed you on. Please explore the resource guides attached to this form that list in detail the outpatient counseling of substance abuse treatment centers as well as the inpatient residential substance abuse centers Please read attached guide concerning alcohol withdrawal syndrome Please follow-up with [facility] gastroenterology for your elevated liver function tests Please begin taking lactulose that I prescribed you.  Please also begin taking oral Zofran for nausea

## 2023-01-31 NOTE — ED Triage Notes (Signed)
Pt has been able to take medications

## 2023-02-02 ENCOUNTER — Telehealth: Payer: Self-pay

## 2023-02-02 ENCOUNTER — Telehealth: Payer: Self-pay | Admitting: Nurse Practitioner

## 2023-02-02 NOTE — Telephone Encounter (Signed)
Pt would like a call back concerning what was discussed in his last visit.

## 2023-02-02 NOTE — Transitions of Care (Post Inpatient/ED Visit) (Signed)
   02/02/2023  Name: Chase Townsend MRN: [id number] DOB: 08-13-1996  Today's TOC FU Call Status: Today's TOC FU Call Status:: Unsuccessul Call (1st Attempt) Unsuccessful Call (1st Attempt) Date: 02/02/23  Attempted to reach the patient regarding the most recent Inpatient/ED visit.  Follow Up Plan: Additional outreach attempts will be made to reach the patient to complete the Transitions of Care (Post Inpatient/ED visit) call.   Signature Arvil Persons, BSN, Charity fundraiser

## 2023-02-02 NOTE — Telephone Encounter (Signed)
Called patient back and he wanted Chase Townsend that he will be admitted to [facility] hospital due to his platelets being low.   He will need to get an infusion.

## 2023-02-04 NOTE — Transitions of Care (Post Inpatient/ED Visit) (Signed)
   02/04/2023  Name: Onesimo Lingard MRN: [id number] DOB: 08-01-1996  Today's TOC FU Call Status: Today's TOC FU Call Status:: Unsuccessful Call (2nd Attempt) Unsuccessful Call (1st Attempt) Date: 02/02/23 Unsuccessful Call (2nd Attempt) Date: 02/04/23  Attempted to reach the patient regarding the most recent Inpatient/ED visit.  Follow Up Plan: Additional outreach attempts will be made to reach the patient to complete the Transitions of Care (Post Inpatient/ED visit) call.   Signature Arvil Persons, BSN, Charity fundraiser

## 2023-02-10 ENCOUNTER — Telehealth: Payer: Self-pay

## 2023-02-10 NOTE — Transitions of Care (Post Inpatient/ED Visit) (Unsigned)
   02/10/2023  Name: Shauna Varelas MRN: [id number] DOB: Nov 16, 1995  Today's TOC FU Call Status: Today's TOC FU Call Status:: Unsuccessul Call (1st Attempt) Unsuccessful Call (1st Attempt) Date: 02/10/23  Attempted to reach the patient regarding the most recent Inpatient/ED visit.  Follow Up Plan: Additional outreach attempts will be made to reach the patient to complete the Transitions of Care (Post Inpatient/ED visit) call.   Signature   Agnes Lawrence, CMA (AAMA)  [facility] [phone]

## 2023-02-11 NOTE — Transitions of Care (Post Inpatient/ED Visit) (Unsigned)
   02/11/2023  Name: Chase Townsend MRN: [id number] DOB: [birth year]  Today's TOC FU Call Status: Today's TOC FU Call Status:: Unsuccessful Call (2nd Attempt) Unsuccessful Call (1st Attempt) Date: 02/10/23 Unsuccessful Call (2nd Attempt) Date: 02/11/23  Attempted to reach the patient regarding the most recent Inpatient/ED visit.  Follow Up Plan: Additional outreach attempts will be made to reach the patient to complete the Transitions of Care (Post Inpatient/ED visit) call.   Signature Agnes Lawrence, CMA (AAMA)  [facility] [phone]

## 2023-02-11 NOTE — Transitions of Care (Post Inpatient/ED Visit) (Signed)
   02/11/2023  Name: Chase Townsend MRN: [id number] DOB: [birth year]  Today's TOC FU Call Status: Today's TOC FU Call Status:: Unsuccessful Call (3rd Attempt) Unsuccessful Call (1st Attempt) Date: 02/02/23 Unsuccessful Call (2nd Attempt) Date: 02/04/23 Unsuccessful Call (3rd Attempt) Date: 02/11/23  Attempted to reach the patient regarding the most recent Inpatient/ED visit.  Follow Up Plan: No further outreach attempts will be made at this time. We have been unable to contact the patient.  Signature Arvil Persons, BSN, Charity fundraiser

## 2023-02-12 NOTE — Transitions of Care (Post Inpatient/ED Visit) (Signed)
   02/12/2023  Name: Chase Townsend MRN: [id number] DOB: 1996/10/01  Today's TOC FU Call Status: Today's TOC FU Call Status:: Unsuccessful Call (3rd Attempt) Unsuccessful Call (1st Attempt) Date: 02/10/23 Unsuccessful Call (2nd Attempt) Date: 02/11/23 Unsuccessful Call (3rd Attempt) Date: 02/12/23  Attempted to reach the patient regarding the most recent Inpatient/ED visit.  Follow Up Plan: No further outreach attempts will be made at this time. We have been unable to contact the patient.  Signature Agnes Lawrence, CMA (AAMA)  [facility] [phone]

## 2023-02-13 ENCOUNTER — Inpatient Hospital Stay: Payer: 59 | Admitting: Nurse Practitioner

## 2023-04-21 ENCOUNTER — Ambulatory Visit: Payer: Self-pay | Admitting: Nurse Practitioner

## 2023-07-20 ENCOUNTER — Ambulatory Visit: Payer: BC Managed Care – PPO | Admitting: Nurse Practitioner

## 2023-07-20 ENCOUNTER — Telehealth: Payer: Self-pay | Admitting: Nurse Practitioner

## 2023-07-20 NOTE — Telephone Encounter (Signed)
10.14.24 no show/no show letter sent

## 2023-07-22 ENCOUNTER — Ambulatory Visit: Payer: BC Managed Care – PPO | Admitting: Nurse Practitioner

## 2023-07-22 ENCOUNTER — Encounter: Payer: Self-pay | Admitting: Nurse Practitioner

## 2023-07-22 VITALS — BP 136/84 | HR 92 | Temp 98.5°F | Ht 67.0 in | Wt 140.8 lb

## 2023-07-22 DIAGNOSIS — Z7952 Long term (current) use of systemic steroids: Secondary | ICD-10-CM | POA: Diagnosis not present

## 2023-07-22 DIAGNOSIS — F3342 Major depressive disorder, recurrent, in full remission: Secondary | ICD-10-CM | POA: Diagnosis not present

## 2023-07-22 DIAGNOSIS — F1010 Alcohol abuse, uncomplicated: Secondary | ICD-10-CM | POA: Diagnosis not present

## 2023-07-22 DIAGNOSIS — I10 Essential (primary) hypertension: Secondary | ICD-10-CM

## 2023-07-22 DIAGNOSIS — Z3009 Encounter for other general counseling and advice on contraception: Secondary | ICD-10-CM

## 2023-07-22 DIAGNOSIS — E7220 Disorder of urea cycle metabolism, unspecified: Secondary | ICD-10-CM | POA: Diagnosis not present

## 2023-07-22 LAB — POCT GLYCOSYLATED HEMOGLOBIN (HGB A1C): Hemoglobin A1C: 4 % (ref 4.0–5.6)

## 2023-07-22 MED ORDER — CARVEDILOL 12.5 MG PO TABS: 12.5000 mg | ORAL_TABLET | Freq: Two times a day (BID) | ORAL | 3 refills | Status: AC

## 2023-07-22 MED ORDER — AMLODIPINE BESYLATE 10 MG PO TABS: 10.0000 mg | ORAL_TABLET | Freq: Every day | ORAL | 3 refills | Status: AC

## 2023-07-22 NOTE — Assessment & Plan Note (Signed)
Resolved Last ammonia level at 38 on 02/2023

## 2023-07-22 NOTE — Assessment & Plan Note (Signed)
BP at goal with amlodipine and coreg BP Readings from Last 3 Encounters:  07/22/23 136/84  01/31/23 (!) 140/93  01/20/23 (!) 130/90    Reviewed CMP completed by [facility] provider 07/2023 Maintain med doses Refill sent

## 2023-07-22 NOTE — Assessment & Plan Note (Signed)
Reports he has completed 2weeks inpatient rehab program and attends AA meetings, but still drinks occasionally. He agreed to schedule appointment with therapist. Entered new referral and provided phone number. Advised to avoid any use of ALCOHOL.

## 2023-07-22 NOTE — Patient Instructions (Addendum)
Call [phone] to schedule appointment with therapist. You will be contacted to schedule appointment with urology.

## 2023-07-22 NOTE — Progress Notes (Signed)
Established Patient Visit  Patient: Chase Townsend   DOB: Dec 05, 1995   27 y.o. Male  MRN: [id number] Visit Date: 07/22/2023  Subjective:    Chief Complaint  Patient presents with   Follow-up   HPI Hyperammonemia (HCC) Resolved Last ammonia level at 38 on 02/2023  Depression Denies need for any medications Agreed to schedule appointment with therapist  Alcohol abuse Reports he has completed 2weeks inpatient rehab program and attends AA meetings, but still drinks occasionally. He agreed to schedule appointment with therapist. Entered new referral and provided phone number. Advised to avoid any use of ALCOHOL.  Current chronic use of systemic steroids Repeat hgb A1c: 4% Normal electrolytes  Hypertension BP at goal with amlodipine and coreg BP Readings from Last 3 Encounters:  07/22/23 136/84  01/31/23 (!) 140/93  01/20/23 (!) 130/90    Reviewed CMP completed by [facility] provider 07/2023 Maintain med doses Refill sent     07/22/2023    1:47 PM 01/20/2023   10:43 AM 08/07/2022   11:23 AM  Depression screen PHQ 2/9  Decreased Interest 0 0 1  Down, Depressed, Hopeless 1 1 1   PHQ - 2 Score 1 1 2   Altered sleeping 2 1 1   Tired, decreased energy 0 2 1  Change in appetite 0 3 1  Feeling bad or failure about yourself  1 1 2   Trouble concentrating 0 0 0  Moving slowly or fidgety/restless 0 1 0  Suicidal thoughts 0 0 1  PHQ-9 Score 4 9 8   Difficult doing work/chores Not difficult at all Not difficult at all Somewhat difficult       07/22/2023    1:48 PM 01/20/2023   10:44 AM 08/07/2022   11:23 AM  GAD 7 : Generalized Anxiety Score  Nervous, Anxious, on Edge 0 1 2  Control/stop worrying 0 0 2  Worry too much - different things 0 0 2  Trouble relaxing 0 0 2  Restless 0 1 1  Easily annoyed or irritable 0 1 2  Afraid - awful might happen 0 0 3  Total GAD 7 Score 0 3 14  Anxiety Difficulty Not difficult at all Somewhat difficult Somewhat difficult     Reviewed medical, surgical, and social history today  Medications: Outpatient Medications Prior to Visit  Medication Sig   acetaminophen (TYLENOL) 650 MG CR tablet Take 650 mg by mouth every 8 (eight) hours as needed for pain.   anifrolumab-fnia (SAPHNELO) 300 MG/2ML SOLN injection Inject 2 mLs into the vein every 30 (thirty) days.   hydroxychloroquine (PLAQUENIL) 200 MG tablet Take 1 tablet by mouth every other day alternating with 2 tablets by mouth daily.   loratadine (CLARITIN) 10 MG tablet Take 10 mg by mouth daily as needed for allergies.    predniSONE (DELTASONE) 10 MG tablet Take 10 mg by mouth daily with breakfast.   riTUXimab (RITUXAN IV) Inject 1 Dose into the vein every 14 (fourteen) days.   [DISCONTINUED] amLODipine (NORVASC) 10 MG tablet Take 1 tablet (10 mg total) by mouth daily.   [DISCONTINUED] carvedilol (COREG) 12.5 MG tablet Take 1 tablet (12.5 mg total) by mouth 2 (two) times daily.   [DISCONTINUED] folic acid (FOLVITE) 1 MG tablet Take 1 mg by mouth daily.   [DISCONTINUED] omega-3 fish oil (MAXEPA) 1000 MG CAPS capsule Take 1 capsule by mouth daily.   [DISCONTINUED] thiamine (VITAMIN B1) 100 MG tablet Take 100 mg by  mouth daily.   [DISCONTINUED] traMADol (ULTRAM) 50 MG tablet Take 50 mg by mouth daily as needed.   hydroxychloroquine (PLAQUENIL) 200 MG tablet Take 1 tablet (200 mg total) by mouth every other day. Alternating with 400 mg   [DISCONTINUED] allopurinol (ZYLOPRIM) 100 MG tablet Take 1 tablet (100 mg total) by mouth daily. (Patient not taking: Reported on 07/22/2023)   [DISCONTINUED] chlordiazePOXIDE (LIBRIUM) 25 MG capsule 50mg  PO TID x 1D, then 25-50mg  PO BID X 1D, then 25-50mg  PO QD X 1D (Patient not taking: Reported on 07/22/2023)   [DISCONTINUED] clobetasol cream (TEMOVATE) 0.05 % Apply 1 Application topically 2 (two) times daily. (Patient not taking: Reported on 07/22/2023)   [DISCONTINUED] lactulose (CHRONULAC) 10 GM/15ML solution Take 15 mLs (10 g  total) by mouth 3 (three) times daily. (Patient not taking: Reported on 07/22/2023)   [DISCONTINUED] ondansetron (ZOFRAN) 4 MG tablet Take 1 tablet (4 mg total) by mouth every 6 (six) hours as needed for nausea or vomiting. (Patient not taking: Reported on 07/22/2023)   [DISCONTINUED] potassium chloride SA (KLOR-CON M) 20 MEQ tablet Take 1 tablet (20 mEq total) by mouth 2 (two) times daily. (Patient not taking: Reported on 07/22/2023)   [DISCONTINUED] predniSONE (DELTASONE) 10 MG tablet Take 4 tablets (40 mg total) by mouth once daily for 7 days, THEN 3 tablets (30 mg total) once daily for 7 days, THEN 2 tablets (20 mg total) once daily for 7 days, THEN 1 tablet (10 mg total) once daily for 7 days.   No facility-administered medications prior to visit.   Reviewed past medical and social history.   ROS per HPI above      Objective:  BP 136/84   Pulse 92   Temp 98.5 F (36.9 C) (Temporal)   Ht 5\' 7"  (1.702 m)   Wt 140 lb 12.8 oz (63.9 kg)   SpO2 96%   BMI 22.05 kg/m      Physical Exam Vitals and nursing note reviewed.  Cardiovascular:     Rate and Rhythm: Normal rate and regular rhythm.     Pulses: Normal pulses.     Heart sounds: Normal heart sounds.  Pulmonary:     Effort: Pulmonary effort is normal.     Breath sounds: Normal breath sounds.  Musculoskeletal:     Right lower leg: No edema.     Left lower leg: No edema.  Neurological:     Mental Status: He is alert and oriented to person, place, and time.  Psychiatric:        Mood and Affect: Mood normal.        Behavior: Behavior normal.        Thought Content: Thought content normal.     Results for orders placed or performed in visit on 07/22/23  POCT glycosylated hemoglobin (Hb A1C)  Result Value Ref Range   Hemoglobin A1C 4.0 4.0 - 5.6 %   HbA1c POC (<> result, manual entry)     HbA1c, POC (prediabetic range)     HbA1c, POC (controlled diabetic range)        Assessment & Plan:    Problem List Items  Addressed This Visit     Alcohol abuse    Reports he has completed 2weeks inpatient rehab program and attends AA meetings, but still drinks occasionally. He agreed to schedule appointment with therapist. Entered new referral and provided phone number. Advised to avoid any use of ALCOHOL.      Current chronic use of systemic steroids  Repeat hgb A1c: 4% Normal electrolytes      Relevant Orders   POCT glycosylated hemoglobin (Hb A1C) (Completed)   Depression    Denies need for any medications Agreed to schedule appointment with therapist      Relevant Orders   Ambulatory referral to Psychology   RESOLVED: Hyperammonemia (HCC)    Resolved Last ammonia level at 38 on 02/2023      Hypertension - Primary    BP at goal with amlodipine and coreg BP Readings from Last 3 Encounters:  07/22/23 136/84  01/31/23 (!) 140/93  01/20/23 (!) 130/90    Reviewed CMP completed by [facility] provider 07/2023 Maintain med doses Refill sent      Relevant Medications   amLODipine (NORVASC) 10 MG tablet   carvedilol (COREG) 12.5 MG tablet   Other Visit Diagnoses     Screening and evaluation for vasectomy       Relevant Orders   Ambulatory referral to Urology      Return in about 3 months (around 10/22/2023) for CPE (fasting).     Alysia Penna, NP

## 2023-07-22 NOTE — Assessment & Plan Note (Signed)
Repeat hgb A1c: 4% Normal electrolytes

## 2023-07-22 NOTE — Assessment & Plan Note (Addendum)
Denies need for any medications Agreed to schedule appointment with therapist

## 2023-10-22 ENCOUNTER — Encounter: Payer: BC Managed Care – PPO | Admitting: Nurse Practitioner

## 2023-10-28 ENCOUNTER — Telehealth: Payer: Self-pay | Admitting: Nurse Practitioner
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
